# Patient Record
Sex: Male | Born: 1940 | Race: Black or African American | Hispanic: No | Smoking: Former smoker
Health system: Southern US, Community
[De-identification: ages and names within clinical notes are randomized; demographics above are authoritative.]

## PROBLEM LIST (undated history)

## (undated) DIAGNOSIS — I10 Essential (primary) hypertension: Secondary | ICD-10-CM

## (undated) DIAGNOSIS — E785 Hyperlipidemia, unspecified: Secondary | ICD-10-CM

## (undated) HISTORY — PX: APPENDECTOMY: SHX54

## (undated) HISTORY — DX: Hyperlipidemia, unspecified: E78.5

## (undated) HISTORY — DX: Essential (primary) hypertension: I10

---

## 2007-12-28 ENCOUNTER — Emergency Department: Payer: Self-pay | Admitting: Unknown Physician Specialty

## 2008-09-30 ENCOUNTER — Encounter: Payer: Self-pay | Admitting: Unknown Physician Specialty

## 2008-10-27 ENCOUNTER — Encounter: Payer: Self-pay | Admitting: Unknown Physician Specialty

## 2008-11-27 ENCOUNTER — Encounter: Payer: Self-pay | Admitting: Unknown Physician Specialty

## 2010-05-30 ENCOUNTER — Emergency Department: Payer: Self-pay | Admitting: Emergency Medicine

## 2010-08-06 ENCOUNTER — Ambulatory Visit: Payer: Self-pay | Admitting: Urology

## 2012-02-27 ENCOUNTER — Ambulatory Visit: Payer: Self-pay | Admitting: Gastroenterology

## 2012-02-27 LAB — COMPREHENSIVE METABOLIC PANEL
Albumin: 4.2 g/dL (ref 3.4–5.0)
Bilirubin,Total: 0.9 mg/dL (ref 0.2–1.0)
Chloride: 105 mmol/L (ref 98–107)
Co2: 28 mmol/L (ref 21–32)
Creatinine: 1.25 mg/dL (ref 0.60–1.30)
EGFR (African American): >60
Osmolality: 284 (ref 275–301)
Potassium: 3.4 mmol/L — ABNORMAL LOW (ref 3.5–5.1)
SGOT(AST): 41 U/L — ABNORMAL HIGH (ref 15–37)
Sodium: 140 mmol/L (ref 136–145)
Total Protein: 8.2 g/dL (ref 6.4–8.2)

## 2012-02-27 LAB — CBC
HGB: 15.1 g/dL (ref 13.0–18.0)
MCHC: 33.1 g/dL (ref 32.0–36.0)
Platelet: 219 10*3/uL (ref 150–440)
RBC: 5.27 10*6/uL (ref 4.40–5.90)
RDW: 14.1 % (ref 11.5–14.5)

## 2012-02-27 LAB — TROPONIN I: Troponin-I: <0.02 ng/mL

## 2012-02-27 LAB — LIPASE, BLOOD: Lipase: 132 U/L (ref 73–393)

## 2012-08-25 ENCOUNTER — Emergency Department: Payer: Self-pay | Admitting: Emergency Medicine

## 2012-08-25 LAB — BASIC METABOLIC PANEL
Anion Gap: 9 (ref 7–16)
Calcium, Total: 9.1 mg/dL (ref 8.5–10.1)
Co2: 25 mmol/L (ref 21–32)
EGFR (African American): >60
EGFR (Non-African Amer.): 57 — ABNORMAL LOW
Sodium: 139 mmol/L (ref 136–145)

## 2012-08-25 LAB — URINALYSIS, COMPLETE
Nitrite: NEGATIVE
Ph: 6 (ref 4.5–8.0)
Protein: 100
Squamous Epithelial: NONE SEEN

## 2012-08-25 LAB — CBC
HCT: 41.7 % (ref 40.0–52.0)
HGB: 13.8 g/dL (ref 13.0–18.0)
MCH: 28.2 pg (ref 26.0–34.0)
MCV: 85 fL (ref 80–100)
RBC: 4.9 10*6/uL (ref 4.40–5.90)

## 2012-08-25 LAB — TROPONIN I: Troponin-I: <0.02 ng/mL

## 2012-08-26 ENCOUNTER — Inpatient Hospital Stay: Payer: Self-pay | Admitting: Internal Medicine

## 2012-08-26 LAB — BASIC METABOLIC PANEL
Anion Gap: 10 (ref 7–16)
BUN: 28 mg/dL — ABNORMAL HIGH (ref 7–18)
Calcium, Total: 9.1 mg/dL (ref 8.5–10.1)
Chloride: 106 mmol/L (ref 98–107)
Co2: 24 mmol/L (ref 21–32)
EGFR (Non-African Amer.): 59 — ABNORMAL LOW
Osmolality: 287 (ref 275–301)

## 2012-08-26 LAB — URINALYSIS, COMPLETE
Glucose,UR: NEGATIVE mg/dL (ref 0–75)
Ketone: NEGATIVE
Leukocyte Esterase: NEGATIVE
Nitrite: NEGATIVE
Protein: 30
RBC,UR: 4028 /HPF (ref 0–5)
Specific Gravity: 1.005 (ref 1.003–1.030)

## 2012-08-26 LAB — CBC
HGB: 12.5 g/dL — ABNORMAL LOW (ref 13.0–18.0)
Platelet: 252 10*3/uL (ref 150–440)
RDW: 13.9 % (ref 11.5–14.5)
WBC: 8 10*3/uL (ref 3.8–10.6)

## 2012-08-26 LAB — PROTIME-INR: Prothrombin Time: 13.2 secs (ref 11.5–14.7)

## 2012-08-27 LAB — URINE CULTURE

## 2014-01-27 LAB — COMPREHENSIVE METABOLIC PANEL
ALK PHOS: 98 U/L
Albumin: 3.9 g/dL (ref 3.4–5.0)
Anion Gap: 9 (ref 7–16)
BILIRUBIN TOTAL: 0.9 mg/dL (ref 0.2–1.0)
BUN: 20 mg/dL — ABNORMAL HIGH (ref 7–18)
CALCIUM: 9.3 mg/dL (ref 8.5–10.1)
CHLORIDE: 103 mmol/L (ref 98–107)
Co2: 28 mmol/L (ref 21–32)
Creatinine: 1.09 mg/dL (ref 0.60–1.30)
EGFR (African American): >60
EGFR (Non-African Amer.): >60
GLUCOSE: 90 mg/dL (ref 65–99)
OSMOLALITY: 282 (ref 275–301)
Potassium: 3.3 mmol/L — ABNORMAL LOW (ref 3.5–5.1)
SGOT(AST): 33 U/L (ref 15–37)
SGPT (ALT): 38 U/L (ref 12–78)
SODIUM: 140 mmol/L (ref 136–145)
TOTAL PROTEIN: 7.7 g/dL (ref 6.4–8.2)

## 2014-01-27 LAB — CBC
HCT: 41.9 % (ref 40.0–52.0)
HGB: 14.1 g/dL (ref 13.0–18.0)
MCH: 28.6 pg (ref 26.0–34.0)
MCHC: 33.6 g/dL (ref 32.0–36.0)
MCV: 85 fL (ref 80–100)
Platelet: 174 10*3/uL (ref 150–440)
RBC: 4.93 10*6/uL (ref 4.40–5.90)
RDW: 14 % (ref 11.5–14.5)
WBC: 6.9 10*3/uL (ref 3.8–10.6)

## 2014-01-28 ENCOUNTER — Inpatient Hospital Stay: Payer: Self-pay | Admitting: Specialist

## 2014-01-28 LAB — URINALYSIS, COMPLETE
Bilirubin,UR: NEGATIVE
GLUCOSE, UR: NEGATIVE mg/dL (ref 0–75)
NITRITE: POSITIVE
PH: 5 (ref 4.5–8.0)
Protein: 30
RBC,UR: 763 /HPF (ref 0–5)
Specific Gravity: 1.02 (ref 1.003–1.030)
Squamous Epithelial: <1

## 2014-01-28 LAB — PROTIME-INR
INR: 0.9
Prothrombin Time: 12.3 secs (ref 11.5–14.7)

## 2014-01-28 LAB — LIPASE, BLOOD: Lipase: 178 U/L (ref 73–393)

## 2014-01-29 LAB — CBC WITH DIFFERENTIAL/PLATELET
BASOS ABS: 0 10*3/uL (ref 0.0–0.1)
BASOS PCT: 0.2 %
Eosinophil #: 0.2 10*3/uL (ref 0.0–0.7)
Eosinophil %: 1.4 %
HCT: 36 % — ABNORMAL LOW (ref 40.0–52.0)
HGB: 11.6 g/dL — ABNORMAL LOW (ref 13.0–18.0)
LYMPHS ABS: 0.8 10*3/uL — AB (ref 1.0–3.6)
LYMPHS PCT: 6.5 %
MCH: 27.8 pg (ref 26.0–34.0)
MCHC: 32.1 g/dL (ref 32.0–36.0)
MCV: 87 fL (ref 80–100)
MONO ABS: 0.4 x10 3/mm (ref 0.2–1.0)
Monocyte %: 3.6 %
NEUTROS PCT: 88.3 %
Neutrophil #: 10.9 10*3/uL — ABNORMAL HIGH (ref 1.4–6.5)
PLATELETS: 136 10*3/uL — AB (ref 150–440)
RBC: 4.15 10*6/uL — ABNORMAL LOW (ref 4.40–5.90)
RDW: 14.3 % (ref 11.5–14.5)
WBC: 12.3 10*3/uL — ABNORMAL HIGH (ref 3.8–10.6)

## 2014-01-29 LAB — MAGNESIUM: MAGNESIUM: 1.6 mg/dL — AB

## 2014-01-29 LAB — BASIC METABOLIC PANEL
ANION GAP: 6 — AB (ref 7–16)
BUN: 20 mg/dL — AB (ref 7–18)
CALCIUM: 8 mg/dL — AB (ref 8.5–10.1)
CO2: 25 mmol/L (ref 21–32)
CREATININE: 1.21 mg/dL (ref 0.60–1.30)
Chloride: 110 mmol/L — ABNORMAL HIGH (ref 98–107)
EGFR (African American): >60
EGFR (Non-African Amer.): 59 — ABNORMAL LOW
Glucose: 84 mg/dL (ref 65–99)
OSMOLALITY: 283 (ref 275–301)
POTASSIUM: 3.7 mmol/L (ref 3.5–5.1)
Sodium: 141 mmol/L (ref 136–145)

## 2014-01-29 LAB — PSA: PSA: 3.1 ng/mL (ref 0.0–4.0)

## 2014-01-30 LAB — URINE CULTURE

## 2014-01-30 LAB — CULTURE, BLOOD (SINGLE)

## 2014-02-03 LAB — CULTURE, BLOOD (SINGLE)

## 2014-02-05 ENCOUNTER — Ambulatory Visit: Payer: Self-pay | Admitting: Ophthalmology

## 2014-02-19 DIAGNOSIS — N139 Obstructive and reflux uropathy, unspecified: Secondary | ICD-10-CM | POA: Insufficient documentation

## 2014-02-19 DIAGNOSIS — C61 Malignant neoplasm of prostate: Secondary | ICD-10-CM | POA: Insufficient documentation

## 2014-02-19 DIAGNOSIS — R310 Gross hematuria: Secondary | ICD-10-CM | POA: Insufficient documentation

## 2014-02-19 DIAGNOSIS — N390 Urinary tract infection, site not specified: Secondary | ICD-10-CM | POA: Insufficient documentation

## 2014-03-25 ENCOUNTER — Ambulatory Visit: Payer: Self-pay | Admitting: Ophthalmology

## 2014-05-28 DIAGNOSIS — I10 Essential (primary) hypertension: Secondary | ICD-10-CM | POA: Insufficient documentation

## 2014-05-28 DIAGNOSIS — N400 Enlarged prostate without lower urinary tract symptoms: Secondary | ICD-10-CM | POA: Insufficient documentation

## 2014-07-01 DIAGNOSIS — E010 Iodine-deficiency related diffuse (endemic) goiter: Secondary | ICD-10-CM | POA: Insufficient documentation

## 2014-08-17 ENCOUNTER — Emergency Department: Payer: Self-pay | Admitting: Student

## 2014-12-16 NOTE — Consult Note (Signed)
Patient seen, chart review, films reviewed, note dictated. Gross hematuria, clot retention, prostate cancer. The CT scan demonstrates a stable mass affect on the base of the bladder.  This may be prostate.  There is a reported history of prostate cancer.  [name] has had no radiation therapy.  [name] was apparently placed on "pills" for treatment.  I assume this means finasteride were similar medication.  [name] had a recent followup with Dr. Ernst Spell.  [name] was scheduled for a CT scan for further evaluation.  [name] presented to the emergency room with gross hematuria.  No urine culture was obtained.  Bacteria and white blood cells were present with blood.  This may represent a urinary tract infection as the source of bleeding.  [name] states [name] was voiding well up until the time the hematuria started.  [name] was having no pain or discomfort.  [name] felt [name] was emptying to adequate completion.  It is unclear if [name] has had a recent cystoscopy.  I was informed that [name] had in a Foley catheter with no bladder irrigation.  [name] however has been on bladder irrigation since admission.  His urine is clear on slow CBI.  There has been no significant bleeding since admission as best as I can determine.  [name] had been taking Aleve twice daily for arthritis.  It is recommended that [name] discontinue this for now.  Utilize Tylenol only.  An order has been entered for catheter removal and discontinuation of CBI.  It is also unclear why [name] has been kept n.p.o.  A diet order has also been entered.  If [name] voids adequately with a reasonable postvoid residual, it is okay to proceed with discharge.  [name] will need a followup in the next week or so with Dr. Ernst Spell for further evaluation with cystoscopy.  [name] will need to complete a course of antibiotic therapy.  There is no indication for emergent urological evaluation or intervention at this time.  A family member present was requesting cystoscopy today with Dr. Ernst Spell.  This is likely not a possibility.  There is no  current indication for cystoscopy in the operating room setting.  Outpatient evaluation is all that is recommended.  If there are any further questions, please feel free to contact us.  If [name] voids with inadequate postvoid residual, the patient can be discharged.  Electronic Signatures: Murrell Redden (MD)  (Signed on 31-Dec-13 07:48)  Authored  Last Updated: 31-Dec-13 07:48 by Murrell Redden (MD)

## 2014-12-16 NOTE — H&P (Signed)
PATIENT NAMEBALTASAR, [name] MR#:  [id number] DATE OF BIRTH:  1941-05-04  DATE OF ADMISSION:  08/26/2012  PRIMARY CARE PHYSICIAN:  Domenick Gong, MD.  REFERRING PHYSICIAN:  Lenise Arena, MD.  CHIEF COMPLAINT: Gross hematuria and inability to urinate.   HISTORY OF PRESENT ILLNESS: The patient is a 74 year old African American male with history of systemic hypertension. He was doing well until Friday evening, that is 2 days ago, when he started to have gross hematuria. He appeared at the Emergency Department yesterday and he had some difficulty in urination at that time with urinary retention.  A Foley catheter was inserted and the patient was discharged on oral antibiotic using ciprofloxacin to have outpatient follow-up with a urologist. However, the patient appeared again at the Emergency Department today, his bladder was distended and the urinary catheter is blocked. His gross hematuria had persisted.  Other than that, the patient has no symptoms. Denies any fever. No chills. No abdominal pain other than the discomfort and distention feeling at the bladder area.    REVIEW OF SYSTEMS:  CONSTITUTIONAL: No fever. No chills. No fatigue.  EYES: No blurring of vision. No double vision.  ENT: No hearing impairment. No sore throat. No dysphagia.  CARDIOVASCULAR: No chest pain. No shortness of breath. No syncope.  RESPIRATORY: No shortness of breath. No cough. No hemoptysis. No chest pain.  GASTROINTESTINAL: No abdominal pain other than the suprapubic discomfort. No vomiting. No diarrhea.  GENITOURINARY: He has gross hematuria.  Additionally, he has discomfort and pain at the suprapubic area and inability to urinate. No frequency of urination.  MUSCULOSKELETAL: No joint pain or swelling. No muscular pain or swelling.  INTEGUMENTARY: No skin rash. No ulcers.  NEUROLOGY: No focal weakness. No seizure activity. No headache.  PSYCHIATRY: No anxiety. No depression.  ENDOCRINE: No polyuria or  polydipsia. No heat or cold intolerance.  HEMATOLOGY: No easy bruisability. No lymph node enlargement.   PAST MEDICAL HISTORY:  Systemic hypertension. In July of this year he had dysphagia secondary to food lodged in the lower end of the esophagus. He underwent upper endoscopy which showed esophagitis and the impacted food was removed. There was also gastric ulcer and duodenitis.   PAST SURGICAL HISTORY: Appendectomy.   SOCIAL HABITS:  Ex chronic smoker. He quit in 1992.  Prior to that he smoked 1 pack a day since age of 37. He drinks alcohol on weekends, primarily little beer and some rum.  FAMILY HISTORY: He has no information about his father, how he died. His mother died after having a heart attack at the age of 46.   SOCIAL HISTORY: He is a retired Librarian, academic at [employer]. He lives at home alone.   ADMISSION MEDICATIONS:  Verapamil SR 120 mg twice a day and hydrochlorothiazide with lisinopril 20/12.5 mg once a day.  Yesterday he was started on ciprofloxacin 500 mg twice a day. He also takes medication for claustrophobia.   ALLERGIES: No known drug allergies.   PHYSICAL EXAMINATION: VITAL SIGNS: Blood pressure 144/73, respiratory rate 18, pulse 86, temperature 97.8, pulse oximetry 96%.  GENERAL APPEARANCE: Elderly male lying in bed in no acute distress. He felt much relief after irrigation of his catheter.  HEAD: No pallor. No icterus. No cyanosis.  ENT: Ear examination revealed normal hearing. No discharge. No lesions. Nasal mucosa was normal and showed no lesions, no discharge. No ulcers. Oropharyngeal area was normal without any ulcers, no oral thrush. He is edentulous and wearing dentures.  EYES: Normal eyelids  and conjunctiva. Pupils both of them were constricted. I could not see reactivity to light.  NECK: Supple. Trachea at midline. No thyromegaly. No cervical lymphadenopathy. No masses.  HEART: Normal S1, S2. No S3, S4. No murmur. No gallop. No carotid bruits.  LUNGS: Normal  breathing pattern without use of accessory muscles. No rales. No wheezing.  ABDOMEN: Soft without tenderness. No hepatosplenomegaly. No masses. No hernias.  SKIN: No ulcers. No subcutaneous nodules.  MUSCULOSKELETAL: No joint swelling. No clubbing.   NEUROLOGIC: Cranial nerves II through XII are intact. No focal motor deficit.  UROLOGIC:  The patient has a Foley catheter inserted and it is draining gross blood with some clots.  PSYCHIATRIC: The patient is alert and oriented x 3. Mood and affect were normal.   LABORATORY FINDINGS:  Serum glucose 133, BUN 28, creatinine 1.2, sodium 140, potassium 3.9.  His troponin less than 0.02. CBC showed white count of 8000, hemoglobin 12.5. His hemoglobin yesterday was 13.8, hematocrit 38, yesterday was 41. Platelet count 252. Urinalysis done yesterday showed red-colored urine, 100 of protein, +3 blood, 13,209 red blood cells, 1600 white blood cells, +3 bacteria.   ASSESSMENT: 1.  Gross hematuria.  2.  Urinary retention likely secondary to the blood clots; however, intravesical lesions or  TMRs versus stones are other possibilities.  3.  Mild anemia secondary to gross hematuria.  4.  Systemic hypertension.   PLAN: We will admit the patient to the medical floor and consultation with urology. I will keep the Foley catheter for the time being. The catheter was irrigated with 30 mL of normal saline, following which patency of the catheter was restored. The patient had relief after that.   Blood clots were observed passing through with the urinary catheter. I will send urine for culture and I will continue ciprofloxacin that was started yesterday. I will continue his home medications. The patient evidently is going to need cystoscopy. Regarding his anemia, his hemoglobin was normal yesterday at 13.8, now dropped to 12.5. I will repeat the hemoglobin in the morning. For deep vein thrombosis prophylaxis I will place TED stockings since anticoagulation is contraindicated  with his gross hematuria. The patient indicates that he has a LIVING WILL and he had appointed his daughter to have the power of attorney.   CODE STATUS:  He is full code.   Time Spent in evaluating this patient: More than 50 minutes.    ____________________________ Clovis Pu. Lenore Manner, MD amd:ct D: 08/26/2012 23:42:55 ET T: 08/27/2012 13:22:03 ET JOB#: [id number]  cc: Clovis Pu. Lenore Manner, MD, <Dictator> Ellin Saba MD ELECTRONICALLY SIGNED 08/28/2012 6:32

## 2014-12-19 NOTE — Discharge Summary (Signed)
PATIENT NAMEMATAEO, [name] MR#:  [id number] DATE OF BIRTH:  01/19/1941  DATE OF ADMISSION:  08/26/2012 DATE OF DISCHARGE:  08/28/2012  DISCHARGE DIAGNOSES: 1. Hematuria.  2. Urinary tract infection.  3. Prostate cancer.  4. Hypertension.   HISTORY OF PRESENT ILLNESS: The patient is a 74 year old African American male with history of systemic hypertension. He was doing well until Friday evening, that was 2 days ago, presentation to the Emergency Room when he started having gross hematuria. He presented to the Emergency Room the next day and had some difficulty in the urination at that time with urinary retention. A Foley catheter was inserted, and he was discharged home on oral antibiotics, using ciprofloxacin, to have outpatient followup with a urologist.  He appeared in the Emergency Room the next day, again, because his bladder was distended and the urinary catheter was blocked. His gross hematuria has persisted. Other than that, there has been no symptoms. He denied any fever or chills, abdominal pain or discomfort.   HOSPITAL COURSE: Urology physicians were consulted for further management, and in the ER the catheter was irrigated with 30 mL of  normal saline; and following that, the patency of the catheter was restored and he had relief with bladder discomfort after that.  Blood clots were observed passing through the urinary catheter. Urine was sent for culture, and he was continued on ciprofloxacin that was started the day before. His hemoglobin on the previous day when he came to the Emergency Room was 13.8, and it dropped to 12.5 the next day at presentation to the Emergency Room. He was monitored for that on the medical floor.  A urologist saw him in the ER and started him on continuous bladder irrigation. We followed his hemoglobin, which remained stable throughout the hospital course. Urologic consult with Dr. Jacqlyn Larsen the next day in the hospital was done. His urine drainage through the  irrigation catheter remained clear, and so Dr. Jacqlyn Larsen suggested that he has to follow with Dr. Ernst Spell, who was his primary urologist. Advised to have outpatient followup with Dr. Ernst Spell and advised to discharge the patient with antibiotics.   Other medical issues during this hospital stay:  1. Hypertension: Stable. Continue home medications. 2. Mild anemia secondary to gross hematuria: It remained stable. 3. Urinary retention: Improved after continuous bladder irrigation.  4. History of prostate cancer: He was advised to follow up with his primary urologist after discharge.   LABORATORY DATA: His hemoglobin remained up to 12.4 on discharge, no further drop. Urine culture suggestive of contamination.   CONDITION ON DISCHARGE: Stable.   CODE STATUS ON DISCHARGE: FULL CODE.   MEDICATIONS ADVISED ON DISCHARGE: Hydrochlorothiazide/lisinopril 1 tablet orally once a day, verapamil 180 mg 12-hour oral tablet once a day, acetaminophen/oxycodone 325/5 mg oral tablet every 4 to 6 hours as needed for pain, and ciprofloxacin 500 mg oral tablet every 12 hours for 7 days.   HOME HEALTH: No.   HOME OXYGEN: No.   DIET: Low sodium and consistency regular.   ACTIVITY LIMITATIONS: As tolerated.   FOLLOWUP: Follow up with primary care physician and with Dr. Madelin Headings, the urologist, after 1 to 2 weeks.   TIME SPENT ON DISCHARGE: 45 minutes.   ____________________________ Ceasar Lund Anselm Jungling, MD vgv:cb D: 09/03/2012 08:42:47 ET T: 09/03/2012 09:50:00 ET JOB#: [id number]  cc: Ceasar Lund. Anselm Jungling, MD, <Dictator> Fonnie Jarvis. Ilene Qua, MD Vaughan Basta MD ELECTRONICALLY SIGNED 10/09/2012 15:08

## 2014-12-20 NOTE — Consult Note (Signed)
Patient name: [name]  Date of birth: 1941/05/01  Date of consult: January 28, 2014  Reason for consult: Hematuria, sepsis, history of prostate cancer  History:      Mr. Harpole is a 74 year old after American gentleman who presented to the emergency room with hematuria and back pain.  On presentation, he was noted to have a temperature of 103.  A CT scan was obtained demonstrating no evidence of hydronephrosis, renal mass, or obstruction.  He was noted to have significant prostatic enlargement with intravesical protrusion of the prostate.  He also has a history of diverticulosis.  The CT scan did confirm diverticulosis without evidence of diverticulitis.  He was admitted due to sepsis related to an apparent urinary tract infection.  A Foley catheter has been placed although the stated that there would be an attempt at not placing a Foley catheter.  There was no documentation of any urine volume drained from the catheter placement.  200 mL is the first documented urine in the chart.  This is not consistent with urinary retention.  He has had episodes of urinary retention in the past.  He has been under the care of Dr. Ernst Spell for a number of years.  He was diagnosed with Gleason's 6 (3+3) adenocarcinoma the prostate with a single focus at the left lateral base of less than 5%.  He was found to have an iliac abnormality.  He underwent evaluation at [facility].  He underwent subsequent biopsy of the iliac bone lesion.  This returned benign.  It was recommended based on the low volume disease that observation only be undertaken.  He has been utilizing Flomax.  This is not listed on today's evaluation.  It does not appear that he has ever taking finasteride.  He states he had a PSA recently with Dr. Ilene Qua.  We will obtain a copy of this for review.  His initial PSA at the time of biopsy was around 28.  It was felt that his large prostate volume was the main factor with PSA elevation and not the malignancy.  There  is minimal bleeding on current evaluation.  The bleeding is likely related to the large prostate and infection.  The infection is likely also related to the large prostate and bladder emptying.  He will need eventual evaluation from the hematuria standpoint.  He denies any other significant prior urological history.  Past medical history: Hypertension, prostate cancer, peptic ulcer disease, dysphagia, bladder outlet obstruction  Past surgical history: Appendectomy, hand surgery, iliac biopsy, prostate biopsy  Social history: There is a past history of tobacco use.  He quit in 1992.  He also has a history of alcohol use.  He denies any drug use.  Family history: Coronary artery disease  Medications on admission: Verapamil SR 120 mg twice daily, hydrochlorothiazide/lisinopril 20/12.5 twice daily, tamsulosin 0.4 mg daily  Allergies: No known drug allergies  Physical examination:      Temp: 98.7 heart rate: 81  respiratory rate: 18  blood pressure: 116/76 Gen.: Awake, alert, oriented 4 HEENT: Within normal limits Chest: Clear to auscultation bilaterally.  No abnormal breath sounds Cardiovascular: Regular rate and rhythm Abdomen: Soft, nontender, nondistended, no palpable masses, no appreciable CVA tenderness GU: Foley catheter in place draining minimally blood-tinged urine, phallus within normal limits Extremities: Free range of motion 4 Neuro: Motor and sensory grossly intact  Assessment: Low-grade/low risk prostate cancer, hematuria, bladder outlet obstruction  Recommendation: The bleeding is likely secondary to bladder infection from his  enlarged prostate.  The Flomax needs to be restarted.  Addition of finasteride would likely be beneficial both from the prostate cancer and the prosthetic hypertrophy standpoint.  Given the extent of intravesical tissue, he may need an eventual TURP.  He will need a follow-up in the next few weeks as an outpatient for cystoscopy for further evaluation of  his bladder for other sources of the bleeding.  There is no indication for any intervention at present.  I will continue to follow with you.  It is okay to remove his Foley catheter at any time.  If there are any further questions, please feel free to contact us.  Electronic Signatures: Murrell Redden (MD)  (Signed on 02-Jun-15 16:55)  Authored  Last Updated: 02-Jun-15 16:55 by Murrell Redden (MD)

## 2014-12-20 NOTE — Discharge Summary (Signed)
PATIENT NAME:  [name], [name] MR#:  [id number] DATE OF BIRTH:  November 21, 1940  DATE OF ADMISSION:  01/28/2014 DATE OF DISCHARGE:  01/30/2014  For a detailed note, please look at the history and physical done on admission by Dr. Laurin Coder.   DIAGNOSES AT DISCHARGE: As follows: 1.  Sepsis secondary to urinary tract infection.  2.  Urinary tract infection secondary to Escherichia coli.  3.  Benign prostatic hypertrophy.  4.  Hypertension.  5.  Hypokalemia.   DIET: The patient is being discharged on a low-sodium diet.   ACTIVITY: As tolerated.  FOLLOWUP:  Dr. Domenick Gong in the next 1 to 2 weeks. Also follow up with Dr. Edrick Oh from urology in the next 2 weeks.  DISCHARGE MEDICATIONS: Hydrochlorothiazide/lisinopril 25/21 tab daily, verapamil 180 mg b.i.d., Flomax 0.4 mg daily and Ceftin 500 mg b.i.d. x 12 days.   PERTINENT STUDIES DONE DURING THE HOSPITAL COURSE:  Are as follows: A CT scan of the abdomen and pelvis done with contrast on 06/02 showing no acute intra-abdominal findings, chronic massive enlargement of the prostate gland, extensive colonic diverticulosis. The patient's urine culture to be positive for Escherichia coli. Blood cultures, also to be positive for Escherichia coli.   HOSPITAL COURSE: This is a 74 year old male with medical problems as mentioned above, presented to the hospital due to fevers, rigors and abnormal urinalysis.   PROBLEMS: 1.  Sepsis. This was the likely diagnosis given the fact that the patient had abnormal urinalysis consistent with urinary tract infection with high fevers, tachycardia and positive blood cultures. The most likely source of the patient's sepsis was urinary tract infection. The patient was empirically started on IV ceftriaxone. The patient's urine and blood cultures grew out Escherichia coli which was sensitive to ceftriaxone. The patient has been afebrile now for the past 48 hours and hemodynamically stable. He clinically feels much better and  therefore he is being discharged on oral Ceftin for 12 days for a total course of 14-day therapy for his underlying sepsis due to urinary tract infection. 2.  Hematuria. This was likely secondary to the urinary tract infection and underlying benign prostatic hypertrophy. The patient was seen by Dr. Jacqlyn Larsen. He recommended getting an outpatient cystoscopy. He will be referred to him as an outpatient. The patient did have a Foley catheter placed. It was discontinued. He is currently urinating well. He has been discharged on Flomax and his hematuria has now resolved.  3.  Hypotension. This was likely secondary to the sepsis. It has since then improved and resolved with IV fluids.  4.  Hypertension. The patient's blood pressure medications were initially held in the hospital as he was septic and hypotensive, although he will resume his home medications as he is now hemodynamically stable.  5.  Hypokalemia. This has since then been replaced and resolved.  6.  Benign prostatic hypertrophy with bladder outlet obstruction. The patient is being discharged on Flomax. He will follow up with Dr. Jacqlyn Larsen as an outpatient. He currently has no evidence of urinary obstruction and is urinating well.  TIME SPENT: 40 minutes.  ____________________________ Belia Heman. Verdell Carmine, MD vjs:ce D: 01/30/2014 15:22:17 ET T: 01/30/2014 20:16:58 ET JOB#: [id number]  cc: Belia Heman. Verdell Carmine, MD, <Dictator> Fonnie Jarvis. Ilene Qua, MD Denice Bors Jacqlyn Larsen, MD Henreitta Leber MD ELECTRONICALLY SIGNED 02/01/2014 21:55

## 2014-12-20 NOTE — H&P (Signed)
PATIENT NAME:  [name], [name] MR#:  [id number] DATE OF BIRTH:  1941-03-07  DATE OF ADMISSION:  01/28/2014  REASON FOR ADMISSION: Severe sepsis.   REFERRING PHYSICIAN: Dr. Carrie Mew   PRIMARY CARE PHYSICIAN: Dr. Ilene Qua  CHIEF COMPLAINT: Hematuria.   HISTORY OF PRESENT ILLNESS: This is a very nice, 74 year old gentleman who has history of hypertension, who has been in his normal state of health. Overall, he is very active. Lives by himself, but this past evening, at around 5:00 p.m., he started having significant hematuria and pain at the level of his back. The patient had an evolution of that pain, getting worse, more hematuria occurred, for what he decided to come to the Emergency Department. Here in the Emergency Department, the patient had severe fever, up to 103, shaking rigors, back spasms and, again, a temperature of 103, for what he received antibiotics. CT scan was done. As far as his pain goes, it is located in the middle of the back, radiating to both costovertebral angles. The patient had no acute intra-abdominal findings on a CT scan but massive enlargement of prostate. There was diverticulosis without diverticulitis. Again, the pain intensity was 9 out of 10, and was relieved with pain medications here in the ER. He was worse with ambulation. The patient has not had any significant urinary retention. He states that in the past, he has seen a urologist at [facility], and he was told that he might have prostate cancer, but it was not worrisome at the moment. The patient is admitted for treatment and evaluation of this condition.   REVIEW OF SYSTEMS: A 12-system review of systems review of systems is done.  CONSTITUTIONAL: Positive fever and chills this evening but no prior. No significant weight loss or weight gain.  EYES: No blurry vision, double vision.  EARS, NOSE, THROAT: No difficulty swallowing at this moment, but the patient had, in the past, a history of dysphagia secondary to a big  piece of meat stuck in his esophagus, now resolved.  CARDIOVASCULAR: No chest pain, shortness of breath, syncope.  RESPIRATORY: No chronic obstructive pulmonary disease, although the patient was smoker. He quit several years ago. No cough. No wheezing. The patient has been sneezing. GASTROINTESTINAL: No abdominal pain, constipation or diarrhea.  GENITOURINARY: Positive for gross hematuria. Positive dysuria. Positive costovertebral angle tenderness.  MUSCULOSKELETAL: No significant joint pain or swelling.  SKIN: No rashes or petechiae.  NEUROLOGIC: No numbness, tingling, or previous CVAs.  PSYCHIATRIC: No anxiety, depression, insomnia.  ENDOCRINE: No polyuria, polydipsia, polyphagia, cold or heat intolerance.  HEMATOLOGIC AND LYMPHATIC: No anemia, easy bruising. Positive hematuria.   PAST MEDICAL HISTORY: 1.  Hypertension.  2.  Apparently, history of prostate cancer.  3.  History of peptic ulcer with duodenitis.  4.  History of dysphagia secondary to food lodged in the lower end of the esophagus, now  resolved.   PAST SURGICAL HISTORY: Appendectomy and hand surgery.   SOCIAL HISTORY: The patient used to smoke. He quit in 1992. He used to smoke somewhere around 1 pack a day since the age of 74. The patient used to drink significant amounts of alcohol on a regular basis, but he quit a while ago as well. He lives by himself.   FAMILY HISTORY: Father died at an early age. His mother died from a heart attack at the age of 52.   MEDICATIONS: Verapamil SR 120 mg twice daily, hydrochlorothiazide with lisinopril 20/12.5 mg twice daily.   PHYSICAL EXAMINATION: VITAL SIGNS: Blood  pressure 95/48, pulse is 105, respirations 16, temperature 98.1. Highest temperature 103. Oxygen saturation 92% on room air.  GENERAL: The patient is alert and oriented x3, in no acute distress at this moment. No respiratory distress. The patient is diaphoretic. His gown and shirt are profusely wet with sweat.  HEENT: His  pupils are equal and reactive. Extraocular movements are intact. Mucosa is moist. Anicteric sclerae. Pink conjunctivae. No oral lesions. No oropharyngeal exudates.  NECK: Supple. No JVD. No thyromegaly. No adenopathy. No carotid bruits.  CARDIAC: Tachycardic. No displacement of PMI.  ABDOMEN: Soft, nontender, nondistended. No hepatosplenomegaly. No masses. Bowel sounds are positive.  GENITAL: Negative for external lesions. There is a small blood clot hanging out of the urethra. No testicular tenderness. No suprapubic tenderness.  EXTREMITIES: No edema, cyanosis or clubbing. Vascular pulses +2. Capillary refill less than 3. NEUROLOGIC: Cranial nerves II through XII intact. Strength is 5/5 in all four extremities. No focal findings.  SKIN: No rashes or petechiae.  LYMPHATIC: Negative for lymphadenopathy in neck or supraclavicular areas.  MUSCULOSKELETAL: No joint effusions or swelling.   RESULTS: CT scan of the abdomen and pelvis: No acute intra-abdominal findings, chronic massive enlargement of the prostate, extensive colonic diverticulosis without diverticulitis. This is a CT with contrast, by the way. There is circumferential wall thickening of the bladder related to chronic outlet obstruction, presumably. Kidneys: No hydronephrosis or stone. There is a right middle lobe 7 mm subpleural pulmonary nodule. His hemoglobin is actually normal at 14.1. White count 6.9. LFTs are within normal limits. Potassium is 2.3, sodium 140, creatinine 1.09, with a slight elevation of BUN at 20, glucose 90. Urinalysis shows 763 red blood cells, 31 white blood cells, with positive nitrites, trace leukocyte esterase, positive bacteria. Lactic acid of 5.5.   ASSESSMENT AND PLAN: This is a very nice 74 year old gentleman with hypertension and was told at some point that he had prostate cancer.   1.  Severe sepsis. The patient had criteria of severe sepsis based on lactic acid of 5 and low blood pressures. The patient has  received 2 liters of fluid, and his blood pressure is slowly decreasing. At this moment, we are going to continue fluids at 100 mL/h, giving extra bolus if necessary. Blood cultures taken. Urine culture taken. The patient is on Rocephin.  2.  Hematuria. The patient has no signs of urinary retention at this moment. We are going to put a Foley catheter if the patient is having difficulty urinating or if the hematuria persists. Urology consultation. We are going to check a PSA. Continue to monitor. Transfuse if necessary. Check PT, INR, and reverse if necessary.  3.  Hypotension. Continue IV fluids boluses as needed. Monitor under telemetry. His blood pressure is low, but not to the point of shock. The patient can go to the floor unless something drastically changes.  4.  Hypertension. Holding blood pressure medications.  5.  Hypokalemia. Replace with IV fluids.  6.  Gastrointestinal prophylaxis with Protonix. Deep vein thrombosis prophylaxis with compression devices. Avoid heparin, aspirin or Lovenox due to active bleeding.   CODE STATUS: The patient is a full code.   TIME SPENT: I spent about 45 minutes with this patient today.    ____________________________ Liberty Sink, MD rsg:cg D: 01/28/2014 03:38:43 ET T: 01/28/2014 04:20:24 ET JOB#: [id number]  cc: Knoll[place] Sink, MD, <Dictator> Christen Wardrop America Brown MD ELECTRONICALLY SIGNED 02/01/2014 20:13

## 2014-12-21 NOTE — Consult Note (Signed)
PATIENT NAMEPLES, [name] MR#:  [id number] DATE OF BIRTH:  08-31-1940  DATE OF CONSULTATION:  02/27/2012  REFERRING PHYSICIAN:   CONSULTING PHYSICIAN:  Jill Side, MD  REASON FOR CONSULTATION: Acute dysphagia.   HISTORY OF PRESENT ILLNESS: This 74 year old male apparently was in his normal state of health until about 48 hours ago when he developed acute onset dysphagia while eating a piece of steak. He was unable to eat or drink anything after that. He did not seek medical attention until this morning when he went to Urgent Care and was sent to my office. The patient had a cup and some napkins with him as apparently he has been drooling constantly, unable to swallow solids or liquids. Denies any chest pain or shortness of breath. Denies any prior history of food bolus impaction. No fever, chills, etc.  PAST MEDICAL HISTORY: Quite unremarkable. He denies any history of coronary artery disease, diabetes, or stroke, et Ronney Asters.   ALLERGIES: None.   SOCIAL HISTORY: According to him he does not smoke or drink.   FAMILY HISTORY: Unremarkable.   REVIEW OF SYSTEMS: Negative except for what is mentioned in the History of Present Illness.   PHYSICAL EXAMINATION:  GENERAL: Well built male who does not appear to be in any acute distress.   VITAL SIGNS: His vitals were stable. He was afebrile.   LUNGS: Grossly clear to auscultation bilaterally with fair air entry and no added sounds.   CARDIOVASCULAR: Regular rate and rhythm. No gallops or murmur.   ABDOMEN: Examination is quite benign with soft abdomen, nontender, nondistended. No rebound or guarding.   EXTREMITIES: No edema.   NEUROLOGIC: Examination appears to be grossly unremarkable.   LABORATORY, DIAGNOSTIC, AND RADIOLOGICAL DATA: CBC is within normal limits. BUN is 25. The rest of the electrolytes are fine. Serum lipase is 132. Portable chest x-ray unremarkable.   ASSESSMENT AND PLAN:  Patient with acute dysphagia secondary to  food bolus impaction. He has tried carbonated beverages; glucagon was given in the ER without any relief. It was decided to proceed with an upper GI endoscopy. This was discussed with the patient and he agreed. An upper GI endoscopy was performed.  A large food bolus was seen impacted at the lower end of the esophagus. The food bolus was retrieved with the help of basket and forceps. EGD also showed distal esophagitis and a gastric ulcer as well as duodenitis.   RECOMMENDATIONS: Nexium 40 mg once a day. Follow up with me in 4 weeks for a repeat EGD in 6 to 8 weeks.    ____________________________ Jill Side, MD si:bjt D: 02/27/2012 19:09:13 ET T: 02/28/2012 10:15:59 ET JOB#: [id number]  cc: Jill Side, MD, <Dictator> Jill Side MD ELECTRONICALLY SIGNED 03/01/2012 11:04

## 2014-12-21 NOTE — Consult Note (Signed)
Brief Consult Note: Diagnosis: Esophageal foreign body and dysphagia.   Patient was seen by consultant.   Comments: Patient with acute dysphagia while eating steak 48 hours ago. Unable to eat or drink anything since then. Carbonated drinks and Glucagon did not help.  Recommendations: Urgent EGD with disimpaction. The procedure was discussed with the patient in detail. Will proceed.  Electronic Signatures: Jill Side (MD)  (Signed 01-Jul-13 17:49)  Authored: Brief Consult Note   Last Updated: 01-Jul-13 17:49 by Jill Side (MD)

## 2015-01-21 ENCOUNTER — Other Ambulatory Visit: Payer: Self-pay | Admitting: Family Medicine

## 2015-01-21 DIAGNOSIS — E049 Nontoxic goiter, unspecified: Secondary | ICD-10-CM

## 2015-01-21 DIAGNOSIS — IMO0001 Reserved for inherently not codable concepts without codable children: Secondary | ICD-10-CM

## 2015-02-04 ENCOUNTER — Ambulatory Visit
Admission: RE | Admit: 2015-02-04 | Discharge: 2015-02-04 | Disposition: A | Discharge status: Home / Self Care | Payer: PPO | Source: Ambulatory Visit | Attending: Family Medicine | Admitting: Family Medicine

## 2015-02-04 ENCOUNTER — Ambulatory Visit: Payer: Self-pay

## 2015-02-04 ENCOUNTER — Ambulatory Visit: Payer: PPO

## 2015-02-04 DIAGNOSIS — E049 Nontoxic goiter, unspecified: Secondary | ICD-10-CM | POA: Diagnosis present

## 2015-02-04 DIAGNOSIS — I714 Abdominal aortic aneurysm, without rupture: Secondary | ICD-10-CM | POA: Diagnosis not present

## 2015-02-04 DIAGNOSIS — F17210 Nicotine dependence, cigarettes, uncomplicated: Secondary | ICD-10-CM | POA: Insufficient documentation

## 2015-02-04 DIAGNOSIS — I723 Aneurysm of iliac artery: Secondary | ICD-10-CM | POA: Diagnosis not present

## 2015-02-04 DIAGNOSIS — Z8679 Personal history of other diseases of the circulatory system: Secondary | ICD-10-CM | POA: Diagnosis present

## 2015-02-04 DIAGNOSIS — Z09 Encounter for follow-up examination after completed treatment for conditions other than malignant neoplasm: Secondary | ICD-10-CM | POA: Diagnosis present

## 2015-02-04 DIAGNOSIS — IMO0001 Reserved for inherently not codable concepts without codable children: Secondary | ICD-10-CM

## 2015-04-12 IMAGING — CT CT ABD-PELV W/ CM
2 of 5 series · 16 of 46 positions shown, 18 images · IV contrast (isovue)
Comparison: 09/27/2011

CLINICAL DATA: Back pain and hematuria.  SIRS,

EXAM:
CT ABDOMEN AND PELVIS WITH CONTRAST
TECHNIQUE: Multidetector CT imaging of the abdomen and pelvis was performed
using the standard protocol following bolus administration of
intravenous contrast.
CONTRAST:  125 cc Isovue 370 intravenous

[Series 2: routine abd pel with · axial · 0.85mm/px · z∈[-1014,-584]mm · 13 of 98 slices shown, 15 images]
[im 6/98  soft-tissue]
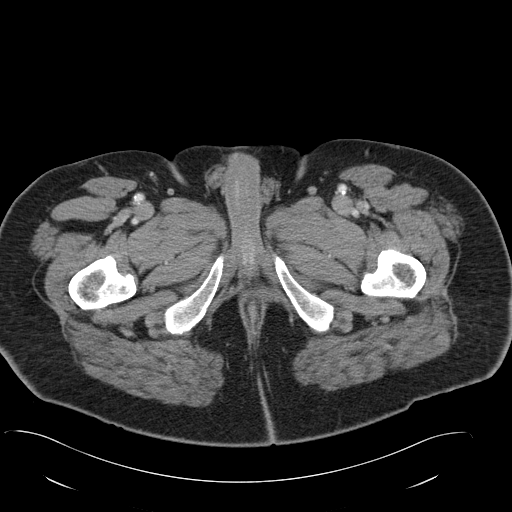
[im 6/98  bone]
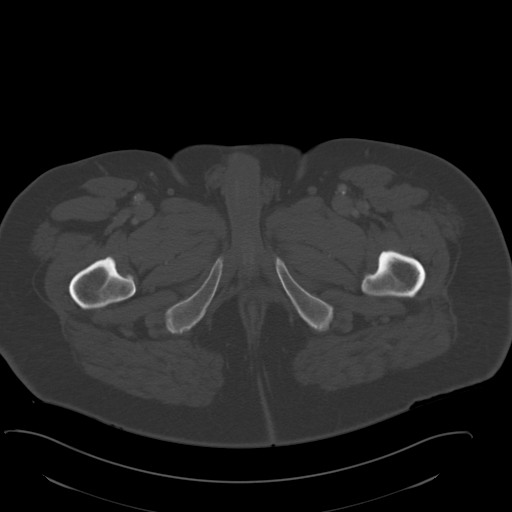
[im 16/98  soft-tissue]
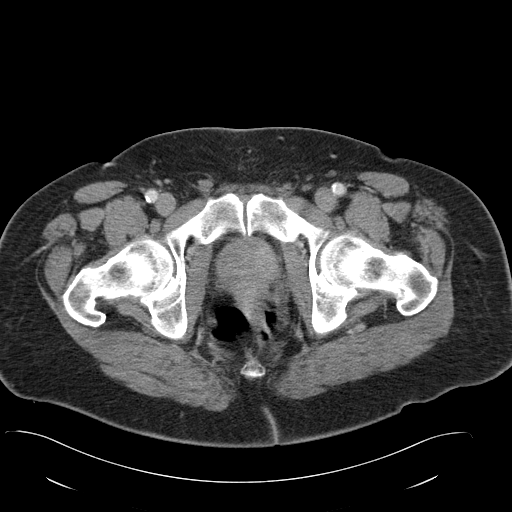
[im 21/98  soft-tissue]
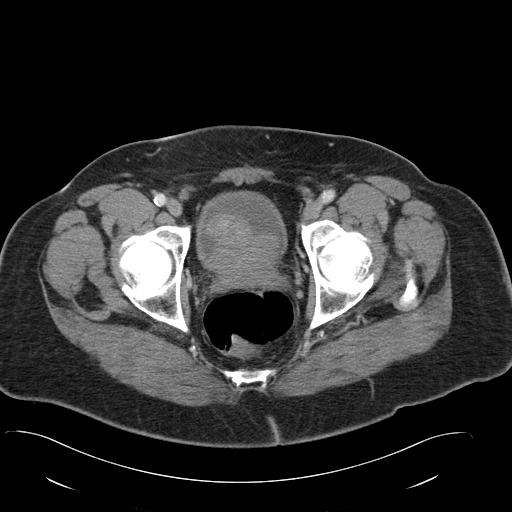
[im 26/98  soft-tissue]
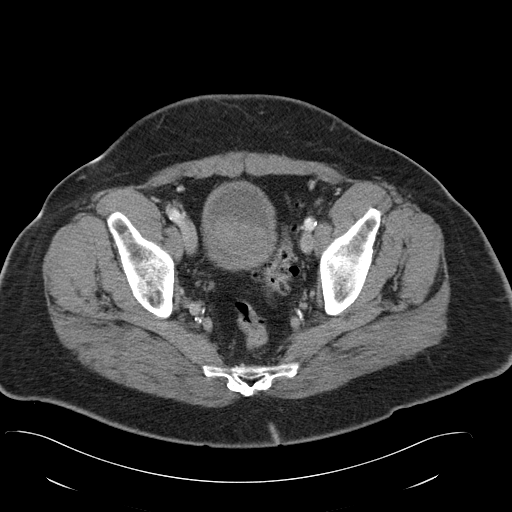
[im 36/98  soft-tissue]
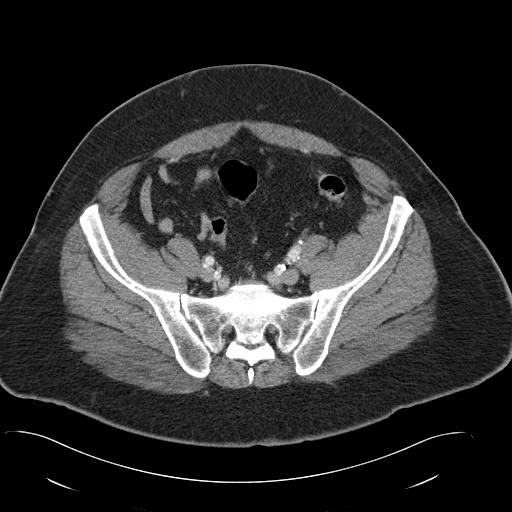
[im 41/98  soft-tissue]
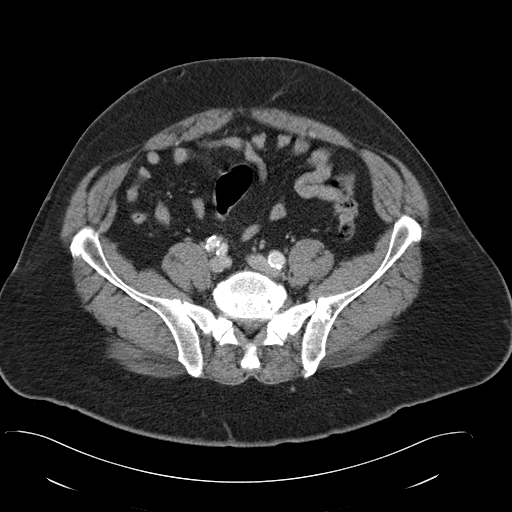
[im 52/98  soft-tissue]
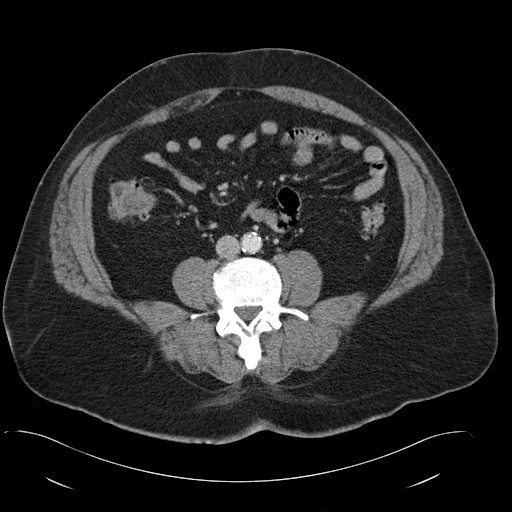
[im 57/98  soft-tissue]
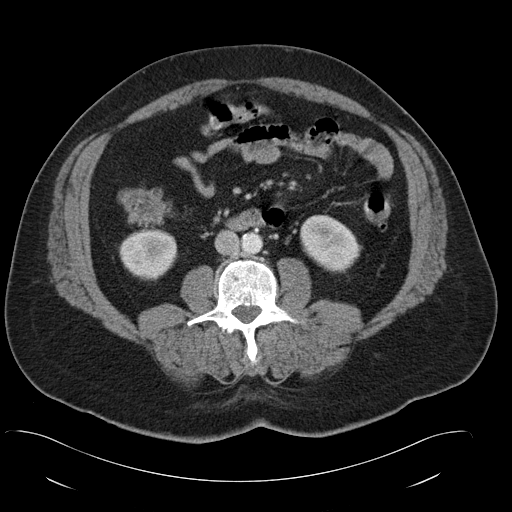
[im 62/98  soft-tissue]
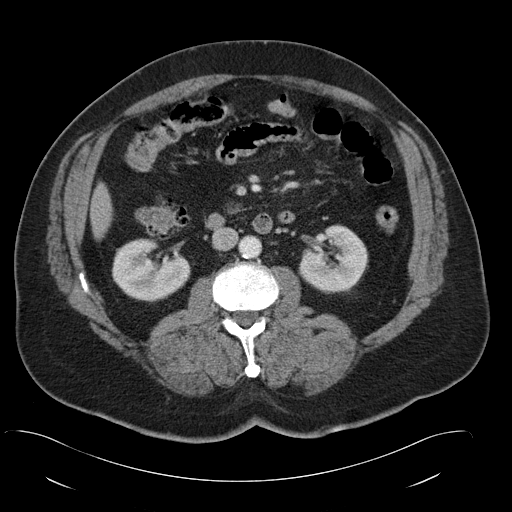
[im 62/98  bone]
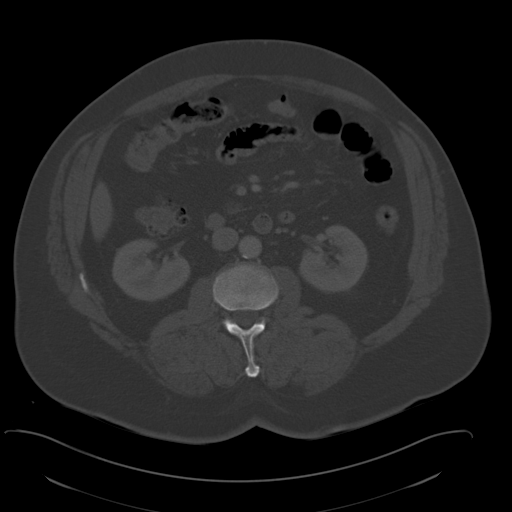
[im 72/98  soft-tissue]
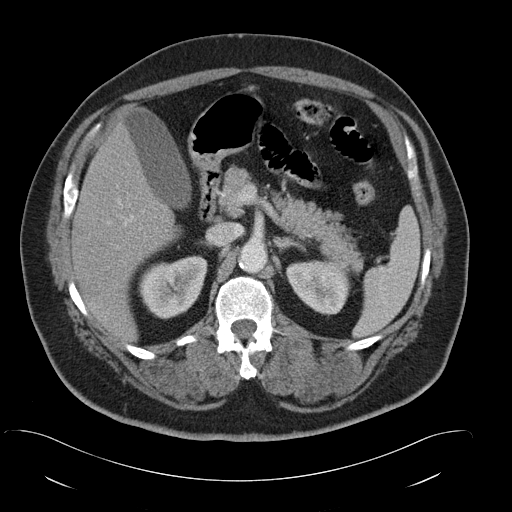
[im 77/98  soft-tissue]
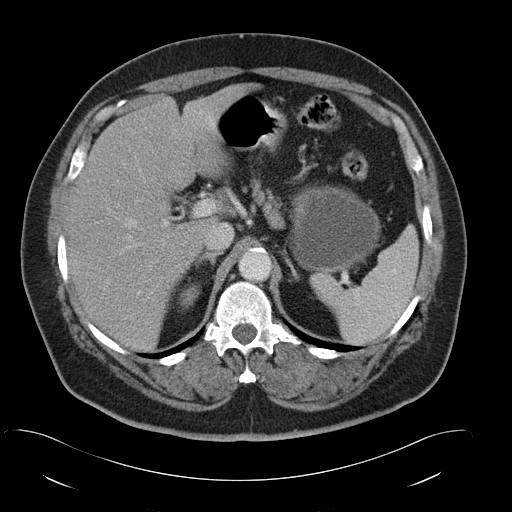
[im 82/98  soft-tissue]
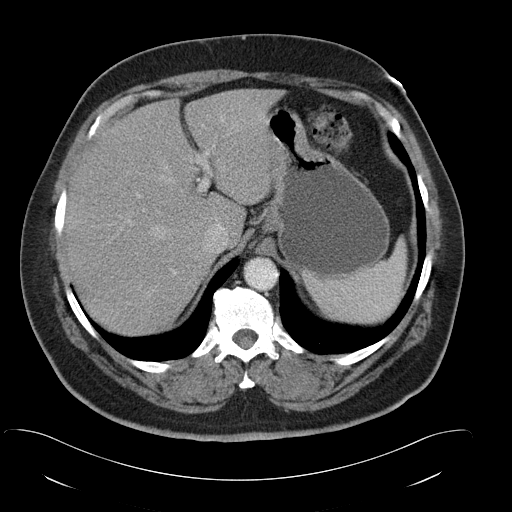
[im 92/98  soft-tissue]
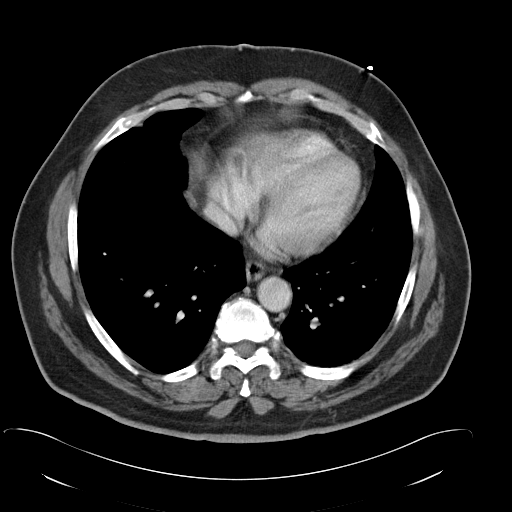

[Series 5: cor routine abd pel with · coronal · 0.83mm/px · 3 of 144 slices shown]
[im 48/144  soft-tissue]
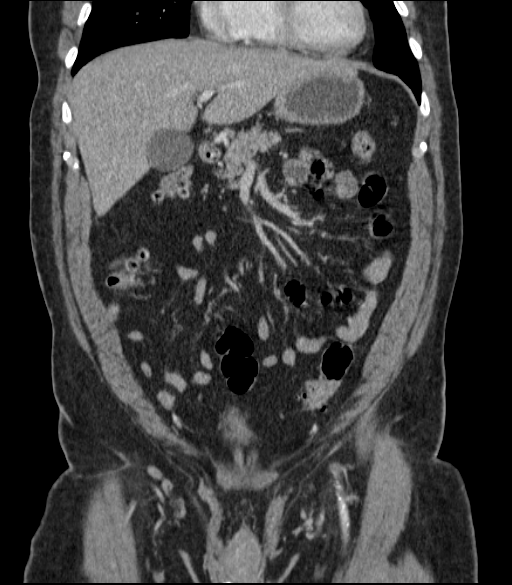
[im 64/144  soft-tissue]
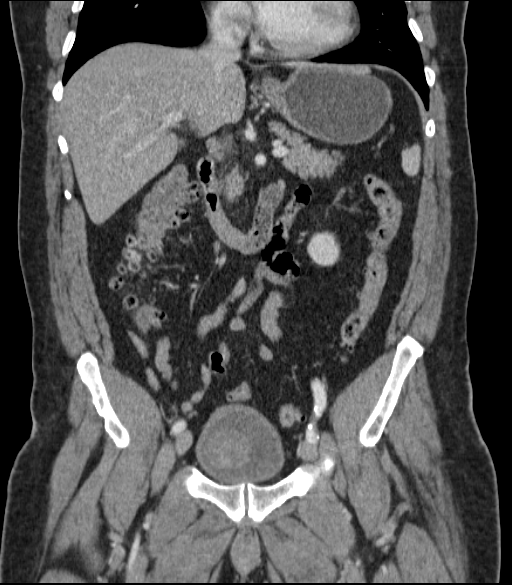
[im 80/144  soft-tissue]
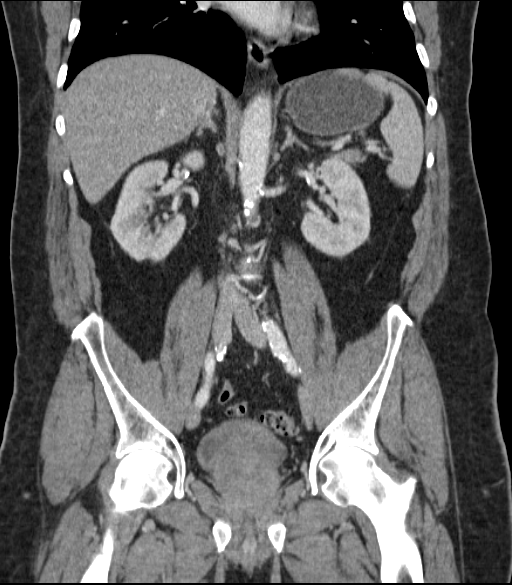

[16 of 46 positions shown; findings below may reference images not displayed]

FINDINGS: BODY WALL: Unremarkable.

LOWER CHEST: Stable subpleural pulmonary nodule in the right middle
lobe, 7 mm in maximal diameter.

ABDOMEN/PELVIS:

Liver: No focal abnormality.

Biliary: No evidence of biliary obstruction or stone.

Pancreas: Unremarkable.

Spleen: Unremarkable.

Adrenals: Unremarkable.

Kidneys and ureters: No hydronephrosis or stone. 17 mm interpolar
left renal cyst.

Bladder: Circumferential wall thickening related to chronic outlet
obstruction presumably.

Reproductive: Long-standing prostate enlargement with polypoid
central gland herniating into the bladder. Long-standing
calcification in the right scrotum, partially imaged.

Bowel: Extensive colonic diverticulosis. No pericecal inflammation.

Retroperitoneum: No mass or adenopathy.

Peritoneum: No free fluid or gas.

Vascular: Diffuse aortic and branch vessel atherosclerosis.

OSSEOUS: Long-standing (Since 3199 at least) sclerotic lesion in the
left ilium, neighboring the SI joint. There is lower lumbar facet
osteoarthritis with bony overgrowth and L4-5 anterolisthesis.
IMPRESSION: 1. No acute intra-abdominal findings.
2. Chronic, massive enlargement of the prostate gland.
3. Extensive colonic diverticulosis.

## 2015-04-12 IMAGING — CR DG CHEST 1V PORT
1 series · 1 of 1 positions shown · non-contrast
Comparison: 02/27/2012

CLINICAL DATA: Fever and tachycardia

EXAM:
PORTABLE CHEST - 1 VIEW

[ap]
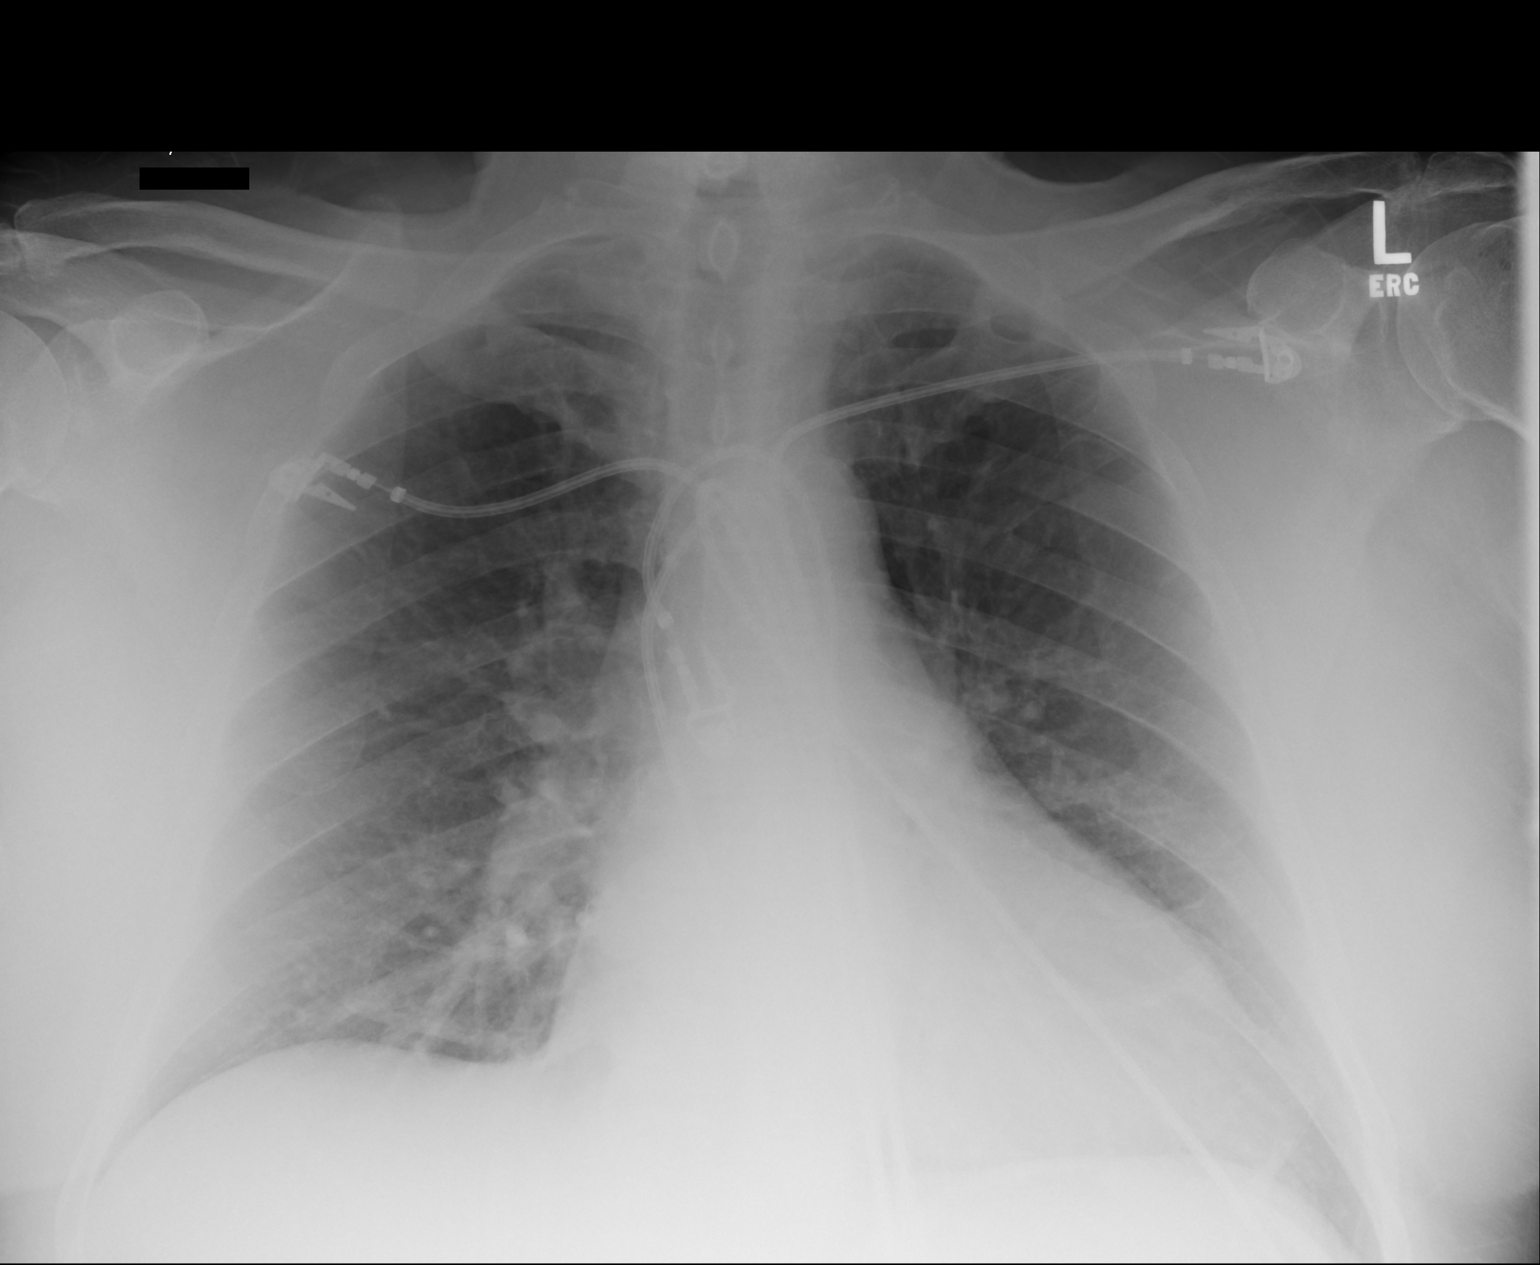

[1 of 1 positions shown; findings below may reference images not displayed]

FINDINGS: Chronic cardiomegaly. There is mild interstitial crowding at the
bases. No consolidation, edema, effusion, or pneumothorax.
IMPRESSION: No active disease.

## 2016-01-28 ENCOUNTER — Other Ambulatory Visit: Payer: Self-pay | Admitting: Family Medicine

## 2016-01-28 DIAGNOSIS — Z136 Encounter for screening for cardiovascular disorders: Secondary | ICD-10-CM

## 2016-02-03 ENCOUNTER — Ambulatory Visit: Admission: RE | Admit: 2016-02-03 | Payer: Medicare HMO | Source: Ambulatory Visit

## 2016-07-21 DIAGNOSIS — M2140 Flat foot [pes planus] (acquired), unspecified foot: Secondary | ICD-10-CM | POA: Insufficient documentation

## 2016-07-27 ENCOUNTER — Other Ambulatory Visit: Payer: Self-pay | Admitting: Family Medicine

## 2016-07-27 DIAGNOSIS — E049 Nontoxic goiter, unspecified: Secondary | ICD-10-CM

## 2016-08-05 ENCOUNTER — Other Ambulatory Visit: Payer: Self-pay | Admitting: Family Medicine

## 2016-08-05 DIAGNOSIS — I714 Abdominal aortic aneurysm, without rupture, unspecified: Secondary | ICD-10-CM

## 2016-08-08 ENCOUNTER — Ambulatory Visit
Admission: RE | Admit: 2016-08-08 | Discharge: 2016-08-08 | Disposition: A | Discharge status: Home / Self Care | Payer: Medicare HMO | Source: Ambulatory Visit | Attending: Family Medicine | Admitting: Family Medicine

## 2016-08-08 DIAGNOSIS — I714 Abdominal aortic aneurysm, without rupture, unspecified: Secondary | ICD-10-CM

## 2016-08-08 DIAGNOSIS — E049 Nontoxic goiter, unspecified: Secondary | ICD-10-CM | POA: Insufficient documentation

## 2016-08-18 ENCOUNTER — Encounter (INDEPENDENT_AMBULATORY_CARE_PROVIDER_SITE_OTHER): Payer: Self-pay | Admitting: Vascular Surgery

## 2016-08-18 ENCOUNTER — Ambulatory Visit (INDEPENDENT_AMBULATORY_CARE_PROVIDER_SITE_OTHER): Payer: Medicare HMO | Admitting: Vascular Surgery

## 2016-08-18 DIAGNOSIS — N401 Enlarged prostate with lower urinary tract symptoms: Secondary | ICD-10-CM | POA: Diagnosis not present

## 2016-08-18 DIAGNOSIS — R3911 Hesitancy of micturition: Secondary | ICD-10-CM | POA: Diagnosis not present

## 2016-08-18 DIAGNOSIS — N400 Enlarged prostate without lower urinary tract symptoms: Secondary | ICD-10-CM | POA: Insufficient documentation

## 2016-08-18 DIAGNOSIS — I10 Essential (primary) hypertension: Secondary | ICD-10-CM | POA: Diagnosis not present

## 2016-08-18 DIAGNOSIS — I714 Abdominal aortic aneurysm, without rupture, unspecified: Secondary | ICD-10-CM

## 2016-08-18 NOTE — Progress Notes (Signed)
MRN : [id number]  [name] is a 75 y.o. (August 15, 1941) male who presents with chief complaint of  Chief Complaint  Patient presents with  . New Evaluation    Abdominal aortic aneursym  .  History of Present Illness: The patient presents to the office for evaluation of an abdominal aortic aneurysm. The aneurysm was found incidentally by CT scan. Patient denies abdominal pain or unusual back pain, no other abdominal complaints.  No history of an acute onset of painful blue discoloration of the toes.     No family history of AAA.   Patient denies amaurosis fugax or TIA symptoms. There is no history of claudication or rest pain symptoms of the lower extremities.  The patient denies angina or shortness of breath.  CT scan shows an AAA that measures <4.00 cm  Current Meds  Medication Sig  . amLODipine (NORVASC) 2.5 MG tablet Take by mouth.  Marland Kitchen aspirin EC 81 MG tablet Take by mouth.  . citalopram (CELEXA) 20 MG tablet Take by mouth.  . finasteride (PROSCAR) 5 MG tablet   . metoprolol-hydrochlorothiazide (LOPRESSOR HCT) 50-25 MG tablet   . potassium chloride SA (K-DUR,KLOR-CON) 20 MEQ tablet Take by mouth.  . tamsulosin (FLOMAX) 0.4 MG CAPS capsule Take by mouth.    Past Medical History:  Diagnosis Date  . Hyperlipidemia   . Hypertension     No past surgical history on file.  Social History Social History  Substance Use Topics  . Smoking status: Former Research scientist (life sciences)  . Smokeless tobacco: Never Used  . Alcohol use Yes    Family History No family history on file. No family history of bleeding/clotting disorders, porphyria or autoimmune disease   Allergies  Allergen Reactions  . Lisinopril Swelling     REVIEW OF SYSTEMS (Negative unless checked)  Constitutional: [] Weight loss  [] Fever  [] Chills Cardiac: [] Chest pain   [] Chest pressure   [] Palpitations   [] Shortness of breath when laying flat   [] Shortness of breath with exertion. Vascular:  [] Pain in legs with walking    [] Pain in legs at rest  [] History of DVT   [] Phlebitis   [] Swelling in legs   [] Varicose veins   [] Non-healing ulcers Pulmonary:   [] Uses home oxygen   [] Productive cough   [] Hemoptysis   [] Wheeze  [] COPD   [] Asthma Neurologic:  [] Dizziness   [] Seizures   [] History of stroke   [] History of TIA  [] Aphasia   [] Vissual changes   [] Weakness or numbness in arm   [] Weakness or numbness in leg Musculoskeletal:   [] Joint swelling   [] Joint pain   [] Low back pain Hematologic:  [] Easy bruising  [] Easy bleeding   [] Hypercoagulable state   [] Anemic Gastrointestinal:  [] Diarrhea   [] Vomiting  [] Gastroesophageal reflux/heartburn   [] Difficulty swallowing. Genitourinary:  [] Chronic kidney disease   [] Difficult urination  [] Frequent urination   [] Blood in urine Skin:  [] Rashes   [] Ulcers  Psychological:  [] History of anxiety   []  History of major depression.  Physical Examination  Vitals:   08/18/16 0903  BP: (!) 154/73  Pulse: 70  Resp: 16  Weight: 257 lb (116.6 kg)  Height: 6\' 2"  (1.88 m)   Body mass index is 33 kg/m. Gen: WD/WN, NAD Head: /AT, No temporalis wasting.  Ear/Nose/Throat: Hearing grossly intact, nares w/o erythema or drainage, poor dentition Eyes: PER, EOMI, sclera nonicteric.  Neck: Supple, no masses.  No bruit or JVD.  Pulmonary:  Good air movement, clear to auscultation bilaterally, no use of  accessory muscles.  Cardiac: RRR, normal S1, S2, no Murmurs. Vascular:  Vessel Right Left  Radial Palpable Palpable  Ulnar Palpable Palpable  Brachial Palpable Palpable  Carotid Palpable Palpable  Femoral Palpable Palpable  Popliteal Palpable Palpable  PT Palpable Palpable  DP Palpable Palpable   Gastrointestinal: soft, non-distended. No guarding/no peritoneal signs.  Musculoskeletal: M/S 5/5 throughout.  No deformity or atrophy.  Neurologic: CN 2-12 intact. Pain and light touch intact in extremities.  Symmetrical.  Speech is fluent. Motor exam as listed above. Psychiatric:  Judgment intact, Mood & affect appropriate for pt's clinical situation. Dermatologic: No rashes or ulcers noted.  No changes consistent with cellulitis. Lymph : No Cervical lymphadenopathy, no lichenification or skin changes of chronic lymphedema.  CBC Lab Results  Component Value Date   WBC 12.3 (H) 01/29/2014   HGB 11.6 (L) 01/29/2014   HCT 36.0 (L) 01/29/2014   MCV 87 01/29/2014   PLT 136 (L) 01/29/2014    BMET    Component Value Date/Time   NA 141 01/29/2014 0516   K 3.7 01/29/2014 0516   CL 110 (H) 01/29/2014 0516   CO2 25 01/29/2014 0516   GLUCOSE 84 01/29/2014 0516   BUN 20 (H) 01/29/2014 0516   CREATININE 1.21 01/29/2014 0516   CALCIUM 8.0 (L) 01/29/2014 0516   GFRNONAA 59 (L) 01/29/2014 0516   GFRAA >60 01/29/2014 0516   CrCl cannot be calculated (Patient's most recent lab result is older than the maximum 21 days allowed.).  COAG Lab Results  Component Value Date   INR 0.9 01/27/2014   INR 1.0 08/26/2012    Radiology US Soft Tissue Head And Neck  Result Date: 08/08/2016 CLINICAL DATA:  75 year old male with a history goiter EXAM: THYROID ULTRASOUND TECHNIQUE: Ultrasound examination of the thyroid gland and adjacent soft tissues was performed. COMPARISON:  None. FINDINGS: Parenchymal Echotexture: Moderately heterogenous Estimated total number of nodules >/= 1 cm: 3 Number of spongiform nodules >/=  2 cm not described below (TR1): 0 Number of mixed cystic and solid nodules >/= 1.5 cm not described below (TR2): 0 _________________________________________________________ Isthmus: 0.9 cm No discrete nodules are identified within the thyroid isthmus. _________________________________________________________ Right lobe: 4.8 cm x 1.8 cm x 1.9 cm Tiny hypoechoic/cystic changes within the right thyroid, which do not meet criteria for surveillance or biopsy. _________________________________________________________ Left lobe: 6.8 cm x 3.1 cm x 3.6 cm Nodule # 1: Location: Left;  Superior Size: 1.0 cm x 0.8 cm x 0.6 cm Composition: mixed cystic and solid (1) Echogenicity: isoechoic (1) Shape: not taller-than-wide (0) Margins: smooth (0) Echogenic foci: none (0) ACR TI-RADS total points: 2. ACR TI-RADS risk category: TR2 (2 points). ACR TI-RADS recommendations: Nodule does not meet criteria for surveillance or biopsy Nodule # 2: Location: Left; Superior Size: 1.3 cm x 0.8 cm x 1.0 cm Composition: mixed cystic and solid (1) Echogenicity: isoechoic (1) Shape: not taller-than-wide (0) Margins: ill-defined (0) Echogenic foci: none (0) ACR TI-RADS total points: 2. ACR TI-RADS risk category: TR2 (2 points). ACR TI-RADS recommendations: Nodule does not meet criteria for surveillance or biopsy Nodule # 3: Location: Left; Inferior Size: 3.9 cm x 2.6 cm x 2.9 cm Composition: solid/almost completely solid (2) Echogenicity: isoechoic (1) Shape: not taller-than-wide (0) Margins: ill-defined (0) Echogenic foci: none (0) ACR TI-RADS total points: 3. ACR TI-RADS risk category: TR3 (3 points). ACR TI-RADS recommendations: Nodule meets criteria for biopsy IMPRESSION: Multiple bilateral thyroid nodules, the majority of which do not meet criteria for surveillance or biopsy. The  inferior left thyroid nodule meets criteria for biopsy, as designated by the newly established ACR TI-RADS criteria, and referral for biopsy is recommended. Recommendations follow those established by the new ACR TI-RADS criteria (J Am Coll Radiol A9880051). Signed, Dulcy Fanny. Earleen Newport, DO [facility] Radiology Electronically Signed   By: Corrie Mckusick D.O.   On: 08/08/2016 13:19   [place] Retroperitoneal Comp  Result Date: 08/08/2016 CLINICAL DATA:  AAA. EXAM: ULTRASOUND RETROPERITONEAL COMPLETE TECHNIQUE: Ultrasound examination of the abdominal aorta was performed to evaluate for abdominal aortic aneurysm. The common iliac arteries, IVC, and kidneys were also evaluated. COMPARISON:   02/04/2015. FINDINGS: Abdominal Aorta Mild aneurysmal dilatation 3.2 cm. Maximum AP Diameter:  3.2 cm Maximum TRV Diameter: 2.8 cm Right Common Iliac Artery Ectasia to 1.4 cm Left Common Iliac Artery Ectasia to 1.4 cm IVC No abnormality visualized. Right Kidney Length: 12.9 cm Echogenicity within normal limits. No mass or hydronephrosis visualized. Left Kidney Length: 12.1 cm Echogenicity within normal limits. No hydronephrosis visualized. 2.0 cm simple cyst. IMPRESSION: 1. Small 3.2 cm abdominal aortic aneurysm . Recommend followup by ultrasound in 3 years. This recommendation follows ACR consensus guidelines: White Paper of the ACR Incidental Findings Committee II on Vascular Findings. J Am Coll Radiol 2013; 10:789-794. 2. 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 Vascular surgery follow-up should be considered. Electronically Signed   By: Marcello Moores  Register   On: 08/08/2016 11:09    Assessment/Plan 1. AAA (abdominal aortic aneurysm) without rupture (HCC) No surgery or intervention at this time. The patient has an asymptomatic abdominal aortic aneurysm that is less than 4 cm in maximal diameter.  I have discussed the natural history of abdominal aortic aneurysm and the small risk of rupture for aneurysm less than 5 cm in size.  However, as these small aneurysms tend to enlarge over time, continued surveillance with ultrasound or CT scan is mandatory.  I have also discussed optimizing medical management with hypertension and lipid control and the importance of abstinence from tobacco.  The patient is also encouraged to exercise a minimum of 30 minutes 4 times a week.  Should the patient develop new onset abdominal or back pain or signs of peripheral embolization they are instructed to seek medical attention immediately and to alert the physician providing care that they have an aneurysm.  The patient voices their understanding. The patient will return in 12 months with an aortic duplex.  - VAS US  AORTA/IVC/ILIACS; Future  2. Essential hypertension Continue antihypertensive medications as already ordered, these medications have been reviewed and there are no changes at this time.   3. Benign prostatic hyperplasia with urinary hesitancy Continue Proscar    Hortencia Pilar, MD  08/18/2016 11:29 AM

## 2016-08-30 ENCOUNTER — Other Ambulatory Visit: Payer: Self-pay | Admitting: Otolaryngology

## 2016-08-30 DIAGNOSIS — E041 Nontoxic single thyroid nodule: Secondary | ICD-10-CM

## 2016-09-05 ENCOUNTER — Ambulatory Visit
Admission: RE | Admit: 2016-09-05 | Discharge: 2016-09-05 | Disposition: A | Discharge status: Home / Self Care | Payer: Medicare HMO | Source: Ambulatory Visit | Attending: Otolaryngology | Admitting: Otolaryngology

## 2016-09-05 DIAGNOSIS — E041 Nontoxic single thyroid nodule: Secondary | ICD-10-CM | POA: Diagnosis not present

## 2016-09-06 LAB — CYTOLOGY - NON PAP

## 2016-09-08 ENCOUNTER — Other Ambulatory Visit: Payer: Self-pay | Admitting: Otolaryngology

## 2016-09-08 DIAGNOSIS — E041 Nontoxic single thyroid nodule: Secondary | ICD-10-CM

## 2017-06-20 DIAGNOSIS — R739 Hyperglycemia, unspecified: Secondary | ICD-10-CM | POA: Insufficient documentation

## 2017-08-28 ENCOUNTER — Ambulatory Visit (INDEPENDENT_AMBULATORY_CARE_PROVIDER_SITE_OTHER): Payer: Medicare HMO | Admitting: Vascular Surgery

## 2017-08-28 ENCOUNTER — Encounter (INDEPENDENT_AMBULATORY_CARE_PROVIDER_SITE_OTHER): Payer: Medicare HMO

## 2017-09-08 ENCOUNTER — Ambulatory Visit: Payer: Medicare HMO

## 2017-09-28 ENCOUNTER — Ambulatory Visit (INDEPENDENT_AMBULATORY_CARE_PROVIDER_SITE_OTHER): Payer: Medicare HMO | Admitting: Vascular Surgery

## 2017-09-28 ENCOUNTER — Ambulatory Visit (INDEPENDENT_AMBULATORY_CARE_PROVIDER_SITE_OTHER): Payer: Medicare HMO

## 2017-09-28 ENCOUNTER — Encounter (INDEPENDENT_AMBULATORY_CARE_PROVIDER_SITE_OTHER): Payer: Self-pay | Admitting: Vascular Surgery

## 2017-09-28 VITALS — BP 142/74 | HR 60 | Resp 16 | Ht 72.0 in | Wt 255.0 lb

## 2017-09-28 DIAGNOSIS — I714 Abdominal aortic aneurysm, without rupture, unspecified: Secondary | ICD-10-CM

## 2017-09-28 DIAGNOSIS — I10 Essential (primary) hypertension: Secondary | ICD-10-CM

## 2017-09-30 ENCOUNTER — Encounter (INDEPENDENT_AMBULATORY_CARE_PROVIDER_SITE_OTHER): Payer: Self-pay | Admitting: Vascular Surgery

## 2017-09-30 NOTE — Progress Notes (Signed)
MRN : [id number]  [name] is a 77 y.o. (23-Jun-1941) male who presents with chief complaint of  Chief Complaint  Patient presents with  . Follow-up    1 year aoorta iliac f/u  .  History of Present Illness: The patient returns to the office for surveillance of a known abdominal aortic aneurysm. Patient denies abdominal pain or back pain, no other abdominal complaints. No changes suggesting embolic episodes.   There have been no interval changes in the patient's overall health care since his last visit.  Patient denies amaurosis fugax or TIA symptoms. There is no history of claudication or rest pain symptoms of the lower extremities. The patient denies angina or shortness of breath.     Current Meds  Medication Sig  . amLODipine (NORVASC) 2.5 MG tablet Take by mouth.  Marland Kitchen aspirin EC 81 MG tablet Take by mouth.  . finasteride (PROSCAR) 5 MG tablet   . metoprolol tartrate (LOPRESSOR) 50 MG tablet Take by mouth.  . metoprolol-hydrochlorothiazide (LOPRESSOR HCT) 50-25 MG tablet   . tamsulosin (FLOMAX) 0.4 MG CAPS capsule Take by mouth.    Past Medical History:  Diagnosis Date  . Hyperlipidemia   . Hypertension     Past Surgical History:  Procedure Laterality Date  . APPENDECTOMY      Social History Social History   Tobacco Use  . Smoking status: Former Smoker    Last attempt to quit: 09/05/1990    Years since quitting: 27.0  . Smokeless tobacco: Never Used  Substance Use Topics  . Alcohol use: Yes    Alcohol/week: 0.6 oz    Types: 1 Glasses of wine per week  . Drug use: No    Family History History reviewed. No pertinent family history.  Allergies  Allergen Reactions  . Lisinopril Swelling     REVIEW OF SYSTEMS (Negative unless checked)  Constitutional: [] Weight loss  [] Fever  [] Chills Cardiac: [] Chest pain   [] Chest pressure   [] Palpitations   [] Shortness of breath when laying flat   [] Shortness of breath with exertion. Vascular:  [] Pain in legs with  walking   [] Pain in legs at rest  [] History of DVT   [] Phlebitis   [] Swelling in legs   [] Varicose veins   [] Non-healing ulcers Pulmonary:   [] Uses home oxygen   [] Productive cough   [] Hemoptysis   [] Wheeze  [] COPD   [] Asthma Neurologic:  [] Dizziness   [] Seizures   [] History of stroke   [] History of TIA  [] Aphasia   [] Vissual changes   [] Weakness or numbness in arm   [] Weakness or numbness in leg Musculoskeletal:   [] Joint swelling   [] Joint pain   [] Low back pain Hematologic:  [] Easy bruising  [] Easy bleeding   [] Hypercoagulable state   [] Anemic Gastrointestinal:  [] Diarrhea   [] Vomiting  [] Gastroesophageal reflux/heartburn   [] Difficulty swallowing. Genitourinary:  [] Chronic kidney disease   [] Difficult urination  [] Frequent urination   [] Blood in urine Skin:  [] Rashes   [] Ulcers  Psychological:  [] History of anxiety   []  History of major depression.  Physical Examination  Vitals:   09/28/17 0851  BP: (!) 142/74  Pulse: 60  Resp: 16  Weight: 255 lb (115.7 kg)  Height: 6' (1.829 m)   Body mass index is 34.58 kg/m. Gen: WD/WN, NAD Head: [place]/AT, No temporalis wasting.  Ear/Nose/Throat: Hearing grossly intact, nares w/o erythema or drainage Eyes: PER, EOMI, sclera nonicteric.  Neck: Supple, no large masses.   Pulmonary:  Good air movement, no audible  wheezing bilaterally, no use of accessory muscles.  Cardiac: RRR, no JVD Vascular:  Vessel Right Left  Radial Palpable Palpable  PT Palpable Palpable  DP Palpable Palpable  Gastrointestinal: Non-distended. No guarding/no peritoneal signs.  Musculoskeletal: M/S 5/5 throughout.  No deformity or atrophy.  Neurologic: CN 2-12 intact. Symmetrical.  Speech is fluent. Motor exam as listed above. Psychiatric: Judgment intact, Mood & affect appropriate for pt's clinical situation. Dermatologic: No rashes or ulcers noted.  No changes consistent with cellulitis. Lymph : No lichenification or skin changes of chronic lymphedema.  CBC Lab  Results  Component Value Date   WBC 12.3 (H) 01/29/2014   HGB 11.6 (L) 01/29/2014   HCT 36.0 (L) 01/29/2014   MCV 87 01/29/2014   PLT 136 (L) 01/29/2014    BMET    Component Value Date/Time   NA 141 01/29/2014 0516   K 3.7 01/29/2014 0516   CL 110 (H) 01/29/2014 0516   CO2 25 01/29/2014 0516   GLUCOSE 84 01/29/2014 0516   BUN 20 (H) 01/29/2014 0516   CREATININE 1.21 01/29/2014 0516   CALCIUM 8.0 (L) 01/29/2014 0516   GFRNONAA 59 (L) 01/29/2014 0516   GFRAA >60 01/29/2014 0516   CrCl cannot be calculated (Patient's most recent lab result is older than the maximum 21 days allowed.).  COAG Lab Results  Component Value Date   INR 0.9 01/27/2014   INR 1.0 08/26/2012    Radiology No results found.  Assessment/Plan 1. AAA (abdominal aortic aneurysm) without rupture (HCC) No surgery or intervention at this time. The patient has an asymptomatic abdominal aortic aneurysm that is less than 4 cm in maximal diameter.  I have discussed the natural history of abdominal aortic aneurysm and the small risk of rupture for aneurysm less than 5 cm in size.  However, as these small aneurysms tend to enlarge over time, continued surveillance with ultrasound or CT scan is mandatory.  I have also discussed optimizing medical management with hypertension and lipid control and the importance of abstinence from tobacco.  The patient is also encouraged to exercise a minimum of 30 minutes 4 times a week.  Should the patient develop new onset abdominal or back pain or signs of peripheral embolization they are instructed to seek medical attention immediately and to alert the physician providing care that they have an aneurysm.  The patient voices their understanding. The patient will return in 24 months with an aortic duplex.  - VAS US AORTA/IVC/ILIACS; Future  2. Essential hypertension Continue antihypertensive medications as already ordered, these medications have been reviewed and there are no  changes at this time.     Hortencia Pilar, MD  09/30/2017 8:40 PM

## 2018-08-05 ENCOUNTER — Emergency Department
Admission: EM | Admit: 2018-08-05 | Discharge: 2018-08-05 | Disposition: A | Discharge status: Home / Self Care | Payer: Medicare HMO | Attending: Emergency Medicine | Admitting: Emergency Medicine

## 2018-08-05 ENCOUNTER — Encounter: Payer: Self-pay | Admitting: Emergency Medicine

## 2018-08-05 ENCOUNTER — Other Ambulatory Visit: Payer: Self-pay

## 2018-08-05 ENCOUNTER — Emergency Department: Payer: Medicare HMO

## 2018-08-05 DIAGNOSIS — Z79899 Other long term (current) drug therapy: Secondary | ICD-10-CM | POA: Diagnosis not present

## 2018-08-05 DIAGNOSIS — R42 Dizziness and giddiness: Secondary | ICD-10-CM

## 2018-08-05 DIAGNOSIS — Z7982 Long term (current) use of aspirin: Secondary | ICD-10-CM | POA: Insufficient documentation

## 2018-08-05 DIAGNOSIS — I10 Essential (primary) hypertension: Secondary | ICD-10-CM | POA: Diagnosis not present

## 2018-08-05 DIAGNOSIS — Z87891 Personal history of nicotine dependence: Secondary | ICD-10-CM | POA: Insufficient documentation

## 2018-08-05 DIAGNOSIS — H81399 Other peripheral vertigo, unspecified ear: Secondary | ICD-10-CM | POA: Insufficient documentation

## 2018-08-05 LAB — CBC
HCT: 40.8 % (ref 39.0–52.0)
Hemoglobin: 13.6 g/dL (ref 13.0–17.0)
MCH: 28.8 pg (ref 26.0–34.0)
MCHC: 33.3 g/dL (ref 30.0–36.0)
MCV: 86.3 fL (ref 80.0–100.0)
Platelets: 226 10*3/uL (ref 150–400)
RBC: 4.73 MIL/uL (ref 4.22–5.81)
RDW: 13.5 % (ref 11.5–15.5)
WBC: 4.5 10*3/uL (ref 4.0–10.5)
nRBC: 0 % (ref 0.0–0.2)

## 2018-08-05 LAB — TROPONIN I: Troponin I: <0.03 ng/mL (ref <0.03)

## 2018-08-05 LAB — BASIC METABOLIC PANEL
Anion gap: 9 (ref 5–15)
BUN: 13 mg/dL (ref 8–23)
CO2: 28 mmol/L (ref 22–32)
Calcium: 9.4 mg/dL (ref 8.9–10.3)
Chloride: 102 mmol/L (ref 98–111)
Creatinine, Ser: 0.94 mg/dL (ref 0.61–1.24)
GFR calc non Af Amer: >60 mL/min (ref >60)
Glucose, Bld: 104 mg/dL — ABNORMAL HIGH (ref 70–99)
Potassium: 4.2 mmol/L (ref 3.5–5.1)
Sodium: 139 mmol/L (ref 135–145)

## 2018-08-05 MED ORDER — MECLIZINE HCL 25 MG PO TABS
25.0000 mg | ORAL_TABLET | Freq: Once | ORAL | Status: AC
  Administered 2018-08-05: 25 mg via ORAL
  Filled 2018-08-05: qty 1

## 2018-08-05 MED ORDER — MECLIZINE HCL 25 MG PO TABS: 25.0000 mg | ORAL_TABLET | Freq: Three times a day (TID) | ORAL | 0 refills | Status: AC | PRN

## 2018-08-05 NOTE — ED Provider Notes (Signed)
[facility] Emergency Department Provider Note  ___________________________________________   First MD Initiated Contact with Patient 08/05/18 1638     (approximate)  I have reviewed the triage vital signs and the nursing notes.   HISTORY  Chief Complaint Dizziness   HPI [name] is a 77 y.o. male history of hypertension, hyperlipidemia and peripheral vertigo who was presented emergency department vertigo over the past 3 to 4 days.  He says that he has also had a runny nose this morning but denies any ear pain, pressure or ringing in his ears, bilaterally.  He says he went to urgent care yesterday and was given 2.5 mg of amlodipine to be taken daily as well as pantoprazole for reflux.  He states that he is still feeling vertiginous when walking or moving his head from side to side.  However, the symptoms dissipate when he is sitting or still.  He says that he had a decreased appetite today but was able to eat with his family prior to coming to the emergency department.  He is not reporting any weakness or numbness.  Says that he has similar episode previously.  Also reporting rhinorrhea that started this morning.  Patient states that he has elevated blood pressure but that his blood pressure is only increased over the past several days since his symptoms began.   Past Medical History:  Diagnosis Date  . Hyperlipidemia   . Hypertension     Patient Active Problem List   Diagnosis Date Noted  . AAA (abdominal aortic aneurysm) without rupture ([place]) 08/18/2016  . Essential hypertension 08/18/2016  . BPH (benign prostatic hyperplasia) 08/18/2016    Past Surgical History:  Procedure Laterality Date  . APPENDECTOMY      Prior to Admission medications   Medication Sig Start Date End Date Taking? Authorizing Provider  aspirin EC 81 MG tablet Take 81 mg by mouth daily.  07/21/16  Yes [provider]  atorvastatin (LIPITOR) 20 MG tablet Take 20 mg  by mouth daily. 05/09/18 05/09/19 Yes [provider]  finasteride (PROSCAR) 5 MG tablet Take 5 mg by mouth daily.    Yes [provider]  metoprolol tartrate (LOPRESSOR) 50 MG tablet Take 50 mg by mouth 2 (two) times daily.    Yes [provider]  omeprazole (PRILOSEC) 40 MG capsule Take 40 mg by mouth daily.   Yes [provider]  spironolactone (ALDACTONE) 25 MG tablet Take 25 mg by mouth daily. 06/08/18 06/08/19 Yes [provider]  tamsulosin (FLOMAX) 0.4 MG CAPS capsule Take 0.4 mg by mouth daily.  07/20/16  Yes [provider]    Allergies Amlodipine and Lisinopril  History reviewed. No pertinent family history.  Social History Social History   Tobacco Use  . Smoking status: Former Smoker    Last attempt to quit: 09/05/1990    Years since quitting: 27.9  . Smokeless tobacco: Never Used  Substance Use Topics  . Alcohol use: Yes    Alcohol/week: 1.0 standard drinks    Types: 1 Glasses of wine per week  . Drug use: No    Review of Systems  Constitutional: No fever/chills Eyes: No visual changes. ENT: No sore throat. Cardiovascular: Denies chest pain. Respiratory: Denies shortness of breath. Gastrointestinal: No abdominal pain.  No nausea, no vomiting.  No diarrhea.  No constipation. Genitourinary: Negative for dysuria. Musculoskeletal: Negative for back pain. Skin: Negative for rash. Neurological: Negative for headaches, focal weakness or numbness.   ____________________________________________  PHYSICAL EXAM:  VITAL SIGNS: ED Triage Vitals  Enc Vitals Group     BP 08/05/18 1323 (!) 206/80     Pulse Rate 08/05/18 1323 (!) 58     Resp 08/05/18 1323 14     Temp 08/05/18 1323 98.2 F (36.8 C)     Temp Source 08/05/18 1323 Oral     SpO2 08/05/18 1323 95 %     Weight 08/05/18 1327 267 lb (121.1 kg)     Height 08/05/18 1327 6\' 1"  (1.854 m)     Head Circumference --      Peak Flow --      Pain Score 08/05/18  1327 0     Pain Loc --      Pain Edu? --      Excl. in [place]? --     Constitutional: Alert and oriented. Well appearing and in no acute distress. Eyes: Conjunctivae are normal.  Head: Atraumatic.  Normal TMs bilaterally. Nose: No congestion/rhinnorhea. Mouth/Throat: Mucous membranes are moist.  Neck: No stridor.   Cardiovascular: Normal rate, regular rhythm. Grossly normal heart sounds.   Respiratory: Normal respiratory effort.  No retractions. Lungs CTAB. Gastrointestinal: Soft and nontender. No distention Musculoskeletal: No lower extremity tenderness nor edema.  No joint effusions. Neurologic:  Normal speech and language. No gross focal neurologic deficits are appreciated.  No ataxia on finger-to-nose testing.  No nystagmus. Skin:  Skin is warm, dry and intact. No rash noted. Psychiatric: Mood and affect are normal. Speech and behavior are normal.  ____________________________________________   LABS (all labs ordered are listed, but only abnormal results are displayed)  Labs Reviewed  BASIC METABOLIC PANEL - Abnormal; Notable for the following components:      Result Value   Glucose, Bld 104 (*)    All other components within normal limits  CBC  TROPONIN I  URINALYSIS, COMPLETE (UACMP) WITH MICROSCOPIC  CBG MONITORING, ED   ____________________________________________  EKG  ED ECG REPORT I, Doran Stabler, the attending physician, personally viewed and interpreted this ECG.   Date: 08/05/2018  EKG Time: 1328  Rate: 54  Rhythm: sinus bradycardia  Axis: Normal  Intervals:none  ST&T Change: T wave inversions in 3 and aVF as well as V3 through V6 which are new from previous.  However, the last EKG was seen from 2013.  Changes may also be consistent with LVH.  ____________________________________________  RADIOLOGY  CT of the head without acute intracranial abnormalities.  Age-appropriate volume loss  noted. ____________________________________________   PROCEDURES  Procedure(s) performed:   Procedures  Critical Care performed:   ____________________________________________   INITIAL IMPRESSION / ASSESSMENT AND PLAN / ED COURSE  Pertinent labs & imaging results that were available during my care of the patient were reviewed by me and considered in my medical decision making (see chart for details).  DDX: Uncontrolled hypertension, peripheral vertigo, central vertigo, electrolyte abnormality, symptomatic hypertension, intracranial hemorrhage As part of my medical decision making, I reviewed the following data within the [place] Notes from prior outpatient visits  ----------------------------------------- 6:14 PM on 08/05/2018 -----------------------------------------  Patient states that he is feeling improved at this time.  Able to ambulate without any vertiginous symptoms.  Patient will stop taking his amlodipine.  Will be discharged with meclizine and will follow-up with his primary care doctor for a blood pressure recheck.  Troponin also negative.  Likely EKG changes secondary to prolonged exposure to hypertension. ____________________________________________   FINAL CLINICAL IMPRESSION(S) / ED DIAGNOSES  Vertigo.  NEW MEDICATIONS STARTED DURING THIS VISIT:  New Prescriptions   No medications on file     Note:  This document was prepared using Dragon voice recognition software and may include unintentional dictation errors.     Orbie Pyo, MD 08/05/18 [phone]

## 2018-08-05 NOTE — ED Triage Notes (Signed)
Pt to ED with c/o of dizziness that started yesterday. Pt denies chest pain and N/V but states unable to eat since yesterday.  Pt states BP at home "high".

## 2018-08-05 NOTE — ED Notes (Signed)
ED Provider at bedside. 

## 2018-08-08 DIAGNOSIS — R42 Dizziness and giddiness: Secondary | ICD-10-CM | POA: Insufficient documentation

## 2018-09-27 ENCOUNTER — Encounter (INDEPENDENT_AMBULATORY_CARE_PROVIDER_SITE_OTHER): Payer: Medicare HMO

## 2018-12-05 DIAGNOSIS — K219 Gastro-esophageal reflux disease without esophagitis: Secondary | ICD-10-CM | POA: Insufficient documentation

## 2019-01-28 ENCOUNTER — Other Ambulatory Visit: Payer: Self-pay

## 2019-01-28 ENCOUNTER — Ambulatory Visit
Admission: EM | Admit: 2019-01-28 | Discharge: 2019-01-28 | Disposition: A | Discharge status: Home / Self Care | Payer: Medicare HMO | Attending: Family Medicine | Admitting: Family Medicine

## 2019-01-28 ENCOUNTER — Ambulatory Visit (INDEPENDENT_AMBULATORY_CARE_PROVIDER_SITE_OTHER): Payer: Medicare HMO

## 2019-01-28 DIAGNOSIS — S6010XA Contusion of unspecified finger with damage to nail, initial encounter: Secondary | ICD-10-CM | POA: Diagnosis not present

## 2019-01-28 DIAGNOSIS — S60012A Contusion of left thumb without damage to nail, initial encounter: Secondary | ICD-10-CM | POA: Diagnosis not present

## 2019-01-28 DIAGNOSIS — W230XXA Caught, crushed, jammed, or pinched between moving objects, initial encounter: Secondary | ICD-10-CM | POA: Diagnosis not present

## 2019-01-28 DIAGNOSIS — I10 Essential (primary) hypertension: Secondary | ICD-10-CM

## 2019-01-28 MED ORDER — HYDROCODONE-ACETAMINOPHEN 5-325 MG PO TABS: ORAL_TABLET | ORAL | 0 refills | Status: AC

## 2019-01-28 NOTE — ED Provider Notes (Signed)
[facility]    CSN: [id number] Arrival date & time: 01/28/19  1132     History   Chief Complaint Chief Complaint  Patient presents with  . Hand Pain    left thumb    HPI [name] is a 78 y.o. male.   78 yo male with a c/o left thumb pain after he shut it in the car door yesterday. States there's blood under the nail and having throbbing pain.    Hand Pain     Past Medical History:  Diagnosis Date  . Hyperlipidemia   . Hypertension     Patient Active Problem List   Diagnosis Date Noted  . AAA (abdominal aortic aneurysm) without rupture ([place]) 08/18/2016  . Essential hypertension 08/18/2016  . BPH (benign prostatic hyperplasia) 08/18/2016    Past Surgical History:  Procedure Laterality Date  . APPENDECTOMY         Home Medications    Prior to Admission medications   Medication Sig Start Date End Date Taking? Authorizing Provider  aspirin EC 81 MG tablet Take 81 mg by mouth daily.  07/21/16  Yes [provider]  atorvastatin (LIPITOR) 20 MG tablet Take 20 mg by mouth daily. 05/09/18 05/09/19 Yes [provider]  finasteride (PROSCAR) 5 MG tablet Take 5 mg by mouth daily.    Yes [provider]  meclizine (ANTIVERT) 25 MG tablet Take 1 tablet (25 mg total) by mouth 3 (three) times daily as needed for dizziness. 08/05/18  Yes Schaevitz, Randall An, MD  metoprolol tartrate (LOPRESSOR) 50 MG tablet Take 50 mg by mouth 2 (two) times daily.    Yes [provider]  omeprazole (PRILOSEC) 40 MG capsule Take 40 mg by mouth daily.   Yes [provider]  spironolactone (ALDACTONE) 25 MG tablet Take 25 mg by mouth daily. 06/08/18 06/08/19 Yes [provider]  tamsulosin (FLOMAX) 0.4 MG CAPS capsule Take 0.4 mg by mouth daily.  07/20/16  Yes [provider]  HYDROcodone-acetaminophen (NORCO/VICODIN) 5-325 MG tablet 1-2 tabs po bid prn 01/28/19   Norval Gable, MD    Family History History  reviewed. No pertinent family history.  Social History Social History   Tobacco Use  . Smoking status: Former Smoker    Last attempt to quit: 09/05/1990    Years since quitting: 28.4  . Smokeless tobacco: Never Used  Substance Use Topics  . Alcohol use: Yes    Alcohol/week: 1.0 standard drinks    Types: 1 Glasses of wine per week  . Drug use: No     Allergies   Amlodipine and Lisinopril   Review of Systems Review of Systems   Physical Exam Triage Vital Signs ED Triage Vitals  Enc Vitals Group     BP 01/28/19 1217 (!) 181/79     Pulse Rate 01/28/19 1217 (!) 58     Resp 01/28/19 1217 16     Temp 01/28/19 1217 98.1 F (36.7 C)     Temp Source 01/28/19 1217 Oral     SpO2 01/28/19 1217 100 %     Weight 01/28/19 1215 270 lb (122.5 kg)     Height 01/28/19 1215 6\' 1"  (1.854 m)     Head Circumference --      Peak Flow --      Pain Score 01/28/19 1215 10     Pain Loc --      Pain Edu? --      Excl. in [place]? --  No data found.  Updated Vital Signs BP (!) 181/79 (BP Location: Left Arm)   Pulse (!) 58   Temp 98.1 F (36.7 C) (Oral)   Resp 16   Ht 6\' 1"  (1.854 m)   Wt 122.5 kg   SpO2 100%   BMI 35.62 kg/m   Visual Acuity Right Eye Distance:   Left Eye Distance:   Bilateral Distance:    Right Eye Near:   Left Eye Near:    Bilateral Near:     Physical Exam Vitals signs and nursing note reviewed.  Constitutional:      General: He is not in acute distress.    Appearance: He is not toxic-appearing or diaphoretic.  Musculoskeletal:     Left hand: He exhibits tenderness (blood under the nail with tenderness to palpation). He exhibits normal range of motion, no bony tenderness, normal two-point discrimination, normal capillary refill, no deformity, no laceration and no swelling. Normal sensation noted. Normal strength noted.  Neurological:     Mental Status: He is alert.      UC Treatments / Results  Labs (all labs ordered are listed, but only abnormal  results are displayed) Labs Reviewed - No data to display  EKG None  Radiology Dg Finger Thumb Left  Result Date: 01/28/2019 CLINICAL DATA:  Slammed thumb in car door yesterday. Distal pain and nailbed discoloration. EXAM: LEFT THUMB 2+V COMPARISON:  None. FINDINGS: The AP view is slightly rotated. No definite acute fracture or dislocation identified. There are mild degenerative changes at the interphalangeal joint. There is a linear calcification along the head of the proximal phalanx dorsally which appears nonacute. Mild soft tissue swelling is present. IMPRESSION: Soft tissue swelling without evidence of acute fracture or dislocation. Electronically Signed   By: Richardean Sale M.D.   On: 01/28/2019 12:45    Procedures Join Aspiration/Injection Date/Time: 01/28/2019 2:27 PM Performed by: Norval Gable, MD Authorized by: Norval Gable, MD   Consent:    Consent obtained:  Verbal   Consent given by:  Patient   Risks discussed:  Bleeding, infection, nerve damage, pain and poor cosmetic result   Alternatives discussed:  No treatment Location:    Location: left thumb. Anesthesia (see MAR for exact dosages):    Anesthesia method:  None Procedure details:    Preparation: Patient was prepped and draped in usual sterile fashion     Aspirate amount:  Trephination of subungual hematoma performed with cautery with relief of pressure    Aspirate characteristics:  Bloody   Steroid injected: no     Specimen collected: no   Post-procedure details:    Dressing:  Adhesive bandage   Patient tolerance of procedure:  Tolerated well, no immediate complications   (including critical care time)  Medications Ordered in UC Medications - No data to display  Initial Impression / Assessment and Plan / UC Course  I have reviewed the triage vital signs and the nursing notes.  Pertinent labs & imaging results that were available during my care of the patient were reviewed by me and considered in my  medical decision making (see chart for details).      Final Clinical Impressions(s) / UC Diagnoses   Final diagnoses:  Contusion of left thumb without damage to nail, initial encounter  Subungual hematoma of digit of hand, initial encounter    ED Prescriptions    Medication Sig Dispense Auth. Provider   HYDROcodone-acetaminophen (NORCO/VICODIN) 5-325 MG tablet 1-2 tabs po bid prn 6 tablet Norval Gable, MD  1. x-ray result (negative for fracture) and diagnosis reviewed with patient 2. Procedure as per note above 3. rx as per orders above; reviewed possible side effects, interactions, risks and benefits  4. Recommend supportive treatment with wound care 5. Follow-up prn if symptoms worsen or don't improve   Controlled Substance Prescriptions [place] Controlled Substance Registry consulted? Not Applicable   Norval Gable, MD 01/28/19 1432

## 2019-01-28 NOTE — ED Triage Notes (Signed)
Patient complains of left thumb pain after he shut it in a car door yesterday. Patient states that pain has been constant and throbbing.

## 2019-03-22 ENCOUNTER — Other Ambulatory Visit (HOSPITAL_COMMUNITY): Payer: Self-pay | Admitting: Family Medicine

## 2019-03-22 ENCOUNTER — Other Ambulatory Visit: Payer: Self-pay | Admitting: Family Medicine

## 2019-03-22 DIAGNOSIS — R1011 Right upper quadrant pain: Secondary | ICD-10-CM

## 2019-03-26 ENCOUNTER — Other Ambulatory Visit: Payer: Self-pay

## 2019-03-26 ENCOUNTER — Ambulatory Visit
Admission: RE | Admit: 2019-03-26 | Discharge: 2019-03-26 | Disposition: A | Discharge status: Home / Self Care | Payer: Medicare HMO | Source: Ambulatory Visit | Attending: Family Medicine | Admitting: Family Medicine

## 2019-03-26 DIAGNOSIS — R1011 Right upper quadrant pain: Secondary | ICD-10-CM | POA: Insufficient documentation

## 2019-09-25 ENCOUNTER — Other Ambulatory Visit (INDEPENDENT_AMBULATORY_CARE_PROVIDER_SITE_OTHER): Payer: Self-pay | Admitting: Vascular Surgery

## 2019-09-25 DIAGNOSIS — I714 Abdominal aortic aneurysm, without rupture, unspecified: Secondary | ICD-10-CM

## 2019-09-26 ENCOUNTER — Ambulatory Visit (INDEPENDENT_AMBULATORY_CARE_PROVIDER_SITE_OTHER): Payer: Medicare HMO | Admitting: Vascular Surgery

## 2019-09-26 ENCOUNTER — Other Ambulatory Visit (INDEPENDENT_AMBULATORY_CARE_PROVIDER_SITE_OTHER): Payer: Medicare HMO

## 2019-12-16 ENCOUNTER — Emergency Department: Payer: Medicare HMO

## 2019-12-16 ENCOUNTER — Encounter: Payer: Self-pay | Admitting: Emergency Medicine

## 2019-12-16 ENCOUNTER — Emergency Department
Admission: EM | Admit: 2019-12-16 | Discharge: 2019-12-16 | Disposition: A | Discharge status: Home / Self Care | Payer: Medicare HMO | Attending: Emergency Medicine | Admitting: Emergency Medicine

## 2019-12-16 DIAGNOSIS — R531 Weakness: Secondary | ICD-10-CM | POA: Insufficient documentation

## 2019-12-16 DIAGNOSIS — R0602 Shortness of breath: Secondary | ICD-10-CM | POA: Diagnosis not present

## 2019-12-16 DIAGNOSIS — I10 Essential (primary) hypertension: Secondary | ICD-10-CM | POA: Diagnosis not present

## 2019-12-16 DIAGNOSIS — Z87891 Personal history of nicotine dependence: Secondary | ICD-10-CM | POA: Diagnosis not present

## 2019-12-16 LAB — BASIC METABOLIC PANEL
Anion gap: 13 (ref 5–15)
BUN: 7 mg/dL — ABNORMAL LOW (ref 8–23)
CO2: 25 mmol/L (ref 22–32)
Calcium: 9 mg/dL (ref 8.9–10.3)
Chloride: 103 mmol/L (ref 98–111)
Creatinine, Ser: 0.91 mg/dL (ref 0.61–1.24)
GFR calc Af Amer: >60 mL/min (ref >60)
GFR calc non Af Amer: >60 mL/min (ref >60)
Glucose, Bld: 107 mg/dL — ABNORMAL HIGH (ref 70–99)
Potassium: 2.9 mmol/L — ABNORMAL LOW (ref 3.5–5.1)
Sodium: 141 mmol/L (ref 135–145)

## 2019-12-16 LAB — CBC WITH DIFFERENTIAL/PLATELET
Abs Immature Granulocytes: 0.01 10*3/uL (ref 0.00–0.07)
Basophils Absolute: 0 10*3/uL (ref 0.0–0.1)
Basophils Relative: 1 %
Eosinophils Absolute: 0.1 10*3/uL (ref 0.0–0.5)
Eosinophils Relative: 2 %
HCT: 38.1 % — ABNORMAL LOW (ref 39.0–52.0)
Hemoglobin: 12.6 g/dL — ABNORMAL LOW (ref 13.0–17.0)
Immature Granulocytes: 0 %
Lymphocytes Relative: 29 %
Lymphs Abs: 1.4 10*3/uL (ref 0.7–4.0)
MCH: 29.4 pg (ref 26.0–34.0)
MCHC: 33.1 g/dL (ref 30.0–36.0)
MCV: 88.8 fL (ref 80.0–100.0)
Monocytes Absolute: 0.3 10*3/uL (ref 0.1–1.0)
Monocytes Relative: 6 %
Neutro Abs: 3 10*3/uL (ref 1.7–7.7)
Neutrophils Relative %: 62 %
Platelets: 199 10*3/uL (ref 150–400)
RBC: 4.29 MIL/uL (ref 4.22–5.81)
RDW: 14 % (ref 11.5–15.5)
WBC: 4.8 10*3/uL (ref 4.0–10.5)
nRBC: 0 % (ref 0.0–0.2)

## 2019-12-16 LAB — TROPONIN I (HIGH SENSITIVITY)
Troponin I (High Sensitivity): 9 ng/L (ref <18)
Troponin I (High Sensitivity): 15 ng/L (ref <18)

## 2019-12-16 LAB — BRAIN NATRIURETIC PEPTIDE: B Natriuretic Peptide: 24 pg/mL (ref 0.0–100.0)

## 2019-12-16 MED ORDER — POTASSIUM CHLORIDE CRYS ER 20 MEQ PO TBCR
40.0000 meq | EXTENDED_RELEASE_TABLET | Freq: Once | ORAL | Status: AC
  Administered 2019-12-16: 40 meq via ORAL
  Filled 2019-12-16: qty 2

## 2019-12-16 NOTE — ED Triage Notes (Signed)
Pt to ED with c/o of weakness and SOB. Pt denies chest pain. Pt states started feeling "bad" after his PCP changed his BP medication last week.

## 2019-12-16 NOTE — ED Notes (Signed)
Pt and pt's daughter verbalized understanding of discharge instructions. NAD at this time. °

## 2019-12-16 NOTE — ED Provider Notes (Signed)
[facility] Emergency Department Provider Note       Time seen: ----------------------------------------- 10:23 AM on 12/16/2019 -----------------------------------------   I have reviewed the triage vital signs and the nursing notes.  HISTORY   Chief Complaint Weakness and Shortness of Breath    HPI [name] is a 79 y.o. male with a history of hyperlipidemia, hypertension, AAA who presents to the ED for weakness and feeling short of breath.  Patient denies feeling short of breath currently, states he has felt poorly since he has been started a new blood pressure medication.  He denies fevers, chills or other complaints.  Past Medical History:  Diagnosis Date  . Hyperlipidemia   . Hypertension     Patient Active Problem List   Diagnosis Date Noted  . AAA (abdominal aortic aneurysm) without rupture ([place]) 08/18/2016  . Essential hypertension 08/18/2016  . BPH (benign prostatic hyperplasia) 08/18/2016    Past Surgical History:  Procedure Laterality Date  . APPENDECTOMY      Allergies Amlodipine and Lisinopril  Social History Social History   Tobacco Use  . Smoking status: Former Smoker    Quit date: 09/05/1990    Years since quitting: 29.2  . Smokeless tobacco: Never Used  Substance Use Topics  . Alcohol use: Yes    Alcohol/week: 1.0 standard drinks    Types: 1 Glasses of wine per week  . Drug use: No    Review of Systems Constitutional: Negative for fever. Cardiovascular: Negative for chest pain. Respiratory: Positive for shortness of breath Gastrointestinal: Negative for abdominal pain, vomiting and diarrhea. Musculoskeletal: Negative for back pain. Skin: Negative for rash. Neurological: Negative for headaches, positive for weakness  All systems negative/normal/unremarkable except as stated in the HPI  ____________________________________________   PHYSICAL EXAM:  VITAL SIGNS: ED Triage Vitals [12/16/19 1021]  Enc  Vitals Group     BP      Pulse      Resp      Temp      Temp src      SpO2      Weight 270 lb 1 oz (122.5 kg)     Height 6\' 1"  (1.854 m)     Head Circumference      Peak Flow      Pain Score 0     Pain Loc      Pain Edu?      Excl. in [place]?     Constitutional: Alert and oriented. Well appearing and in no distress. Eyes: Conjunctivae are normal. Normal extraocular movements. ENT      Head: Normocephalic and atraumatic.      Nose: No congestion/rhinnorhea.      Mouth/Throat: Mucous membranes are moist.      Neck: No stridor. Cardiovascular: Rapid rate, regular rhythm. No murmurs, rubs, or gallops. Respiratory: Normal respiratory effort without tachypnea nor retractions. Breath sounds are clear and equal bilaterally. No wheezes/rales/rhonchi. Gastrointestinal: Soft and nontender. Normal bowel sounds Musculoskeletal: Nontender with normal range of motion in extremities. No lower extremity tenderness nor edema. Neurologic:  Normal speech and language. No gross focal neurologic deficits are appreciated.  Skin:  Skin is warm, dry and intact. No rash noted. Psychiatric: Mood and affect are normal. Speech and behavior are normal.  ____________________________________________  EKG: Interpreted by me.  Sinus tachycardia with rate of 117 bpm, left anterior fascicular block, normal QT  ____________________________________________  ED COURSE:  As part of my medical decision making, I reviewed the following data within the  electronic MEDICAL RECORD NUMBER History obtained from family if available, nursing notes, old chart and ekg, as well as notes from prior ED visits. Patient presented for weakness and shortness of breath, we will assess with labs and imaging as indicated at this time.   Procedures  [name] was evaluated in Emergency Department on 12/16/2019 for the symptoms described in the history of present illness. He was evaluated in the context of the global COVID-19 pandemic, which  necessitated consideration that the patient might be at risk for infection with the SARS-CoV-2 virus that causes COVID-19. Institutional protocols and algorithms that pertain to the evaluation of patients at risk for COVID-19 are in a state of rapid change based on information released by regulatory bodies including the CDC and federal and state organizations. These policies and algorithms were followed during the patient's care in the ED.  ____________________________________________   LABS (pertinent positives/negatives)  Labs Reviewed  CBC WITH DIFFERENTIAL/PLATELET - Abnormal; Notable for the following components:      Result Value   Hemoglobin 12.6 (*)    HCT 38.1 (*)    All other components within normal limits  BASIC METABOLIC PANEL - Abnormal; Notable for the following components:   Potassium 2.9 (*)    Glucose, Bld 107 (*)    BUN 7 (*)    All other components within normal limits  BRAIN NATRIURETIC PEPTIDE  TROPONIN I (HIGH SENSITIVITY)  TROPONIN I (HIGH SENSITIVITY)    RADIOLOGY Images were viewed by me  Chest x-ray IMPRESSION:  Cardiomegaly and hyperinflation, without acute disease.  ____________________________________________   DIFFERENTIAL DIAGNOSIS   Arrhythmia, MI, medication noncompliance, hypotension, dehydration, electrolyte abnormality, medication side effect  FINAL ASSESSMENT AND PLAN  Weakness, dyspnea, hypertension   Plan: The patient had presented for weakness. Patient's labs did not reveal any acute process. Patient's imaging was overall reassuring.  While he was here he was asymptomatic.  I have advised that he go back to his regular blood pressure dosing and keep a record over the next week to follow-up with his primary care doctor.   Laurence Aly, MD    Note: This note was generated in part or whole with voice recognition software. Voice recognition is usually quite accurate but there are transcription errors that can and very often do  occur. I apologize for any typographical errors that were not detected and corrected.     Earleen Newport, MD 12/16/19 [phone]

## 2020-10-27 ENCOUNTER — Other Ambulatory Visit: Payer: Self-pay | Admitting: Family Medicine

## 2020-10-27 DIAGNOSIS — I714 Abdominal aortic aneurysm, without rupture, unspecified: Secondary | ICD-10-CM

## 2020-11-06 ENCOUNTER — Ambulatory Visit
Admission: RE | Admit: 2020-11-06 | Discharge: 2020-11-06 | Disposition: A | Discharge status: Home / Self Care | Payer: Medicare HMO | Source: Ambulatory Visit | Attending: Family Medicine | Admitting: Family Medicine

## 2020-11-06 ENCOUNTER — Other Ambulatory Visit: Payer: Self-pay

## 2020-11-06 DIAGNOSIS — I714 Abdominal aortic aneurysm, without rupture, unspecified: Secondary | ICD-10-CM

## 2021-02-03 DIAGNOSIS — E669 Obesity, unspecified: Secondary | ICD-10-CM | POA: Diagnosis not present

## 2021-02-03 DIAGNOSIS — E785 Hyperlipidemia, unspecified: Secondary | ICD-10-CM | POA: Diagnosis not present

## 2021-02-03 DIAGNOSIS — F3341 Major depressive disorder, recurrent, in partial remission: Secondary | ICD-10-CM | POA: Diagnosis not present

## 2021-02-03 DIAGNOSIS — Z7982 Long term (current) use of aspirin: Secondary | ICD-10-CM | POA: Diagnosis not present

## 2021-02-03 DIAGNOSIS — M1990 Unspecified osteoarthritis, unspecified site: Secondary | ICD-10-CM | POA: Diagnosis not present

## 2021-02-03 DIAGNOSIS — Z6836 Body mass index (BMI) 36.0-36.9, adult: Secondary | ICD-10-CM | POA: Diagnosis not present

## 2021-02-03 DIAGNOSIS — I2510 Atherosclerotic heart disease of native coronary artery without angina pectoris: Secondary | ICD-10-CM | POA: Diagnosis not present

## 2021-02-03 DIAGNOSIS — N400 Enlarged prostate without lower urinary tract symptoms: Secondary | ICD-10-CM | POA: Diagnosis not present

## 2021-02-03 DIAGNOSIS — I10 Essential (primary) hypertension: Secondary | ICD-10-CM | POA: Diagnosis not present

## 2021-02-03 DIAGNOSIS — I951 Orthostatic hypotension: Secondary | ICD-10-CM | POA: Diagnosis not present

## 2021-02-03 DIAGNOSIS — K219 Gastro-esophageal reflux disease without esophagitis: Secondary | ICD-10-CM | POA: Diagnosis not present

## 2021-02-03 DIAGNOSIS — F419 Anxiety disorder, unspecified: Secondary | ICD-10-CM | POA: Diagnosis not present

## 2021-04-29 DIAGNOSIS — I10 Essential (primary) hypertension: Secondary | ICD-10-CM | POA: Diagnosis not present

## 2021-04-29 DIAGNOSIS — J309 Allergic rhinitis, unspecified: Secondary | ICD-10-CM | POA: Insufficient documentation

## 2021-04-29 DIAGNOSIS — Z0000 Encounter for general adult medical examination without abnormal findings: Secondary | ICD-10-CM | POA: Diagnosis not present

## 2021-04-29 DIAGNOSIS — D649 Anemia, unspecified: Secondary | ICD-10-CM | POA: Diagnosis not present

## 2021-06-22 DIAGNOSIS — G8929 Other chronic pain: Secondary | ICD-10-CM | POA: Diagnosis not present

## 2021-06-22 DIAGNOSIS — M5136 Other intervertebral disc degeneration, lumbar region: Secondary | ICD-10-CM | POA: Diagnosis not present

## 2021-06-22 DIAGNOSIS — M545 Low back pain, unspecified: Secondary | ICD-10-CM | POA: Diagnosis not present

## 2021-06-22 DIAGNOSIS — E669 Obesity, unspecified: Secondary | ICD-10-CM | POA: Diagnosis not present

## 2021-06-22 DIAGNOSIS — M48061 Spinal stenosis, lumbar region without neurogenic claudication: Secondary | ICD-10-CM | POA: Diagnosis not present

## 2021-08-16 DIAGNOSIS — Z23 Encounter for immunization: Secondary | ICD-10-CM | POA: Diagnosis not present

## 2021-09-02 DIAGNOSIS — I714 Abdominal aortic aneurysm, without rupture, unspecified: Secondary | ICD-10-CM | POA: Diagnosis not present

## 2021-09-02 DIAGNOSIS — M4316 Spondylolisthesis, lumbar region: Secondary | ICD-10-CM | POA: Diagnosis not present

## 2021-09-02 DIAGNOSIS — M545 Low back pain, unspecified: Secondary | ICD-10-CM | POA: Diagnosis not present

## 2021-09-02 DIAGNOSIS — M5136 Other intervertebral disc degeneration, lumbar region: Secondary | ICD-10-CM | POA: Diagnosis not present

## 2021-09-02 DIAGNOSIS — G8929 Other chronic pain: Secondary | ICD-10-CM | POA: Diagnosis not present

## 2021-09-03 ENCOUNTER — Other Ambulatory Visit: Payer: Self-pay | Admitting: Orthopedic Surgery

## 2021-09-03 DIAGNOSIS — M545 Low back pain, unspecified: Secondary | ICD-10-CM

## 2021-09-15 ENCOUNTER — Ambulatory Visit
Admission: RE | Admit: 2021-09-15 | Discharge: 2021-09-15 | Disposition: A | Discharge status: Home / Self Care | Payer: Medicare HMO | Source: Ambulatory Visit | Attending: Orthopedic Surgery | Admitting: Orthopedic Surgery

## 2021-09-15 ENCOUNTER — Other Ambulatory Visit: Payer: Self-pay

## 2021-09-15 DIAGNOSIS — M5136 Other intervertebral disc degeneration, lumbar region: Secondary | ICD-10-CM | POA: Diagnosis not present

## 2021-09-15 DIAGNOSIS — M545 Low back pain, unspecified: Secondary | ICD-10-CM | POA: Diagnosis not present

## 2021-09-15 DIAGNOSIS — G8929 Other chronic pain: Secondary | ICD-10-CM | POA: Insufficient documentation

## 2021-09-23 DIAGNOSIS — G8929 Other chronic pain: Secondary | ICD-10-CM | POA: Diagnosis not present

## 2021-09-23 DIAGNOSIS — M4316 Spondylolisthesis, lumbar region: Secondary | ICD-10-CM | POA: Diagnosis not present

## 2021-09-23 DIAGNOSIS — M5136 Other intervertebral disc degeneration, lumbar region: Secondary | ICD-10-CM | POA: Diagnosis not present

## 2021-09-23 DIAGNOSIS — I714 Abdominal aortic aneurysm, without rupture, unspecified: Secondary | ICD-10-CM | POA: Diagnosis not present

## 2021-09-23 DIAGNOSIS — M545 Low back pain, unspecified: Secondary | ICD-10-CM | POA: Diagnosis not present

## 2021-10-27 DIAGNOSIS — I714 Abdominal aortic aneurysm, without rupture, unspecified: Secondary | ICD-10-CM | POA: Diagnosis not present

## 2021-10-27 DIAGNOSIS — I10 Essential (primary) hypertension: Secondary | ICD-10-CM | POA: Diagnosis not present

## 2021-11-02 ENCOUNTER — Other Ambulatory Visit: Payer: Self-pay

## 2021-11-02 ENCOUNTER — Ambulatory Visit
Admission: EM | Admit: 2021-11-02 | Discharge: 2021-11-02 | Disposition: A | Discharge status: Home / Self Care | Payer: Medicare HMO | Attending: Internal Medicine | Admitting: Internal Medicine

## 2021-11-02 DIAGNOSIS — R21 Rash and other nonspecific skin eruption: Secondary | ICD-10-CM | POA: Diagnosis not present

## 2021-11-02 DIAGNOSIS — F40240 Claustrophobia: Secondary | ICD-10-CM | POA: Insufficient documentation

## 2021-11-02 MED ORDER — CEPHALEXIN 500 MG PO CAPS: 500.0000 mg | ORAL_CAPSULE | Freq: Two times a day (BID) | ORAL | 0 refills | Status: AC

## 2021-11-02 MED ORDER — FAMCICLOVIR 500 MG PO TABS: 500.0000 mg | ORAL_TABLET | Freq: Three times a day (TID) | ORAL | 0 refills | Status: AC

## 2021-11-02 NOTE — ED Triage Notes (Signed)
Patient is here for "insect bite" on upper right side of back. Noticed it "Sunday". Numerous "bumps" and swelling around area. No fever.  ?

## 2021-11-02 NOTE — Discharge Instructions (Addendum)
Not clear what is causing the rash/spot on your left mid back, but could be infection, insect bite(s), reaction to something that got on the skin, or inflammation from your body that is misdirected.  We will try treating for bacterial or viral (shingles) infection first--prescriptions for cephalexin (antibiotic) and famciclovir (for shingles possibility) were sent to the pharmacy.  Anticipate gradual improvement in itching/pain and size of the lesion over the next several days.  Recheck or followup with your primary care provider or a dermatologist if not improving as expected. ?

## 2021-11-02 NOTE — ED Provider Notes (Signed)
? ? ?[facility] ? ? ? ?CSN: [id number] ?Arrival date & time: 11/02/21  1119 ? ? ?  ? ?History   ?Chief Complaint ?Chief Complaint  ?Patient presents with  ? Insect Bite  ? ? ?HPI ?[name] is a 81 y.o. male.  He presents to the urgent care today with a "bite" that is itchy and sore, on his left mid back, first noticed on 3/5.  Not sure what it is, but uncomfortable.  No fever, no malaise. ? ?HPI ? ?Past Medical History:  ?Diagnosis Date  ? Hyperlipidemia   ? Hypertension   ? ? ?Patient Active Problem List  ? Diagnosis Date Noted  ? Claustrophobia 11/02/2021  ? Severe obesity (BMI 35.0-39.9) with comorbidity ([place]) 09/05/2021  ? Allergic rhinitis 04/29/2021  ? GERD (gastroesophageal reflux disease) 12/05/2018  ? Vertigo 08/08/2018  ? Elevated blood sugar level 06/20/2017  ? Abdominal aortic aneurysm (AAA) without rupture 08/18/2016  ? 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 Essential hypertension 05/28/2014  ? BPH (benign prostatic hyperplasia) 05/28/2014  ? Gross hematuria 02/19/2014  ? Lower urinary tract infectious disease 02/19/2014  ? Malignant neoplasm of prostate ([place]) 02/19/2014  ? Urinary obstruction, not elsewhere classified 02/19/2014  ? ? ?Past Surgical History:  ?Procedure Laterality Date  ? APPENDECTOMY    ? ? ? ? ? ?Home Medications   ? ?Prior to Admission medications   ?Medication Sig Start Date End Date Taking? Authorizing Provider  ?cephALEXin (KEFLEX) 500 MG capsule Take 1 capsule (500 mg total) by mouth 2 (two) times daily for 10 days. 11/02/21 11/12/21 Yes Wynona Luna, MD  ?famciclovir (FAMVIR) 500 MG tablet Take 1 tablet (500 mg total) by mouth 3 (three) times daily for 7 days. 11/02/21 11/09/21 Yes Wynona Luna, MD  ?ALPRAZolam Duanne Moron) 0.25 MG tablet Take by mouth. 09/06/21   [provider]  ?aspirin EC 81 MG tablet Take 81 mg by mouth daily.  07/21/16   [provider]  ?atorvastatin (LIPITOR) 20 MG tablet Take 20 mg by mouth daily. 05/09/18  12/16/19  [provider]  ?celecoxib (CELEBREX) 200 MG capsule Take 200 mg by mouth 2 (two) times daily. 10/26/21   [provider]  ?citalopram (CELEXA) 20 MG tablet Take 1 tablet by mouth daily. 03/08/21   [provider]  ?cloNIDine (CATAPRES) 0.1 MG tablet Take by mouth. 12/15/20   [provider]  ?finasteride (PROSCAR) 5 MG tablet Take 1 tablet by mouth daily. 05/29/14   [provider]  ?fluticasone (FLONASE) 50 MCG/ACT nasal spray Place 2 sprays into both nostrils daily. 09/27/21   [provider]  ?hydrALAZINE (APRESOLINE) 100 MG tablet Take 100 mg by mouth 2 (two) times daily. 12/12/19   [provider]  ?HYDROcodone-acetaminophen (NORCO/VICODIN) 5-325 MG tablet 1-2 tabs po bid prn ?Patient not taking: Reported on 12/16/2019 01/28/19   Norval Gable, MD  ?isosorbide mononitrate (IMDUR) 30 MG 24 hr tablet Take by mouth. 04/20/21   [provider]  ?meclizine (ANTIVERT) 25 MG tablet Take 1 tablet (25 mg total) by mouth 3 (three) times daily as needed for dizziness. ?Patient not taking: Reported on 12/16/2019 08/05/18   Schaevitz, Randall An, MD  ?meloxicam (MOBIC) 15 MG tablet Take 15 mg by mouth daily. 06/22/21   [provider]  ?metoprolol tartrate (LOPRESSOR) 50 MG tablet Take 50 mg by mouth 2 (two) times daily.     [provider]  ?Metoprolol Tartrate 75 MG TABS Take 1  tablet by mouth 2 (two) times daily. 10/06/21   [provider]  ?omeprazole (PRILOSEC) 40 MG capsule Take 1 capsule by mouth daily. 10/22/21   [provider]  ?spironolactone (ALDACTONE) 100 MG tablet Take 1 tablet by mouth daily. 03/08/21   [provider]  ?spironolactone (ALDACTONE) 25 MG tablet Take 25 mg by mouth daily. 06/08/18 12/16/19  [provider]  ?tamsulosin (FLOMAX) 0.4 MG CAPS capsule Take 0.4 mg by mouth daily.  07/20/16   [provider]  ? ? ?Family History ?No family history on file. ? ?Social  History ?Social History  ? ?Tobacco Use  ? Smoking status: Former  ?  Types: Cigarettes  ?  Quit date: 09/05/1990  ?  Years since quitting: 31.1  ? Smokeless tobacco: Never  ?Vaping Use  ? Vaping Use: Never used  ?Substance Use Topics  ? Alcohol use: Yes  ?  Alcohol/week: 1.0 standard drink  ?  Types: 1 Glasses of wine per week  ?  Comment: Occ.  ? Drug use: No  ? ? ? ?Allergies   ?Amlodipine, Hydralazine, and Lisinopril ? ? ?Review of Systems ?Review of Systems see HPI ? ? ?Physical Exam ?Triage Vital Signs ?ED Triage Vitals  ?Enc Vitals Group  ?   BP 11/02/21 1153 (!) 161/78  ?   Pulse Rate 11/02/21 1153 (!) 53  ?   Resp 11/02/21 1153 20  ?   Temp 11/02/21 1153 98.5 ?F (36.9 ?C)  ?   Temp Source 11/02/21 1153 Oral  ?   SpO2 11/02/21 1153 99 %  ?   Weight 11/02/21 1152 270 lb 1 oz (122.5 kg)  ?   Height --   ?   Pain Score 11/02/21 1151 5  ?   Pain Loc --   ? ? ?Updated Vital Signs ?BP (!) 161/78 (BP Location: Left Arm)   Pulse (!) 53   Temp 98.5 ?F (36.9 ?C) (Oral)   Resp 20   Wt 122.5 kg   SpO2 99%   BMI 35.63 kg/m?  ? ?Physical Exam ?Constitutional:   ?   General: He is not in acute distress. ?   Appearance: He is not ill-appearing or toxic-appearing.  ?   Comments: Good hygiene  ?HENT:  ?   Head: Atraumatic.  ?   Mouth/Throat:  ?   Mouth: Mucous membranes are moist.  ?Eyes:  ?   Conjunctiva/sclera:  ?   Right eye: Right conjunctiva is not injected. No exudate. ?   Left eye: Left conjunctiva is not injected. No exudate. ?   Comments: Conjugate gaze observed  ?Cardiovascular:  ?   Rate and Rhythm: Bradycardia present.  ?Pulmonary:  ?   Effort: Pulmonary effort is normal. No respiratory distress.  ?Abdominal:  ?   General: There is no distension.  ?Musculoskeletal:  ?   Cervical back: Neck supple.  ?   Comments: Walked into the urgent care independently, climbed on/off exam table quickly/easily  ?Skin: ?   General: Skin is warm and dry.  ?   Comments: No cyanosis  ?Neurological:  ?   Mental Status: He is  alert.  ?   Comments: Face symmetric, speech clear/coherent/logical  ? ? ?Photos left mid back 2x3.5"  lesion is mildly indurated, mildly tender, deep vesicles peripherally ? ? ? ? ?UC Treatments / Results  ?Labs ?(all labs ordered are listed, but only abnormal results are displayed) ?Labs Reviewed - No data to display ?NA ? ?EKG ?NA ? ?Radiology ?  No results found. ?NA ? ?Procedures ?Procedures (including critical care time) ?NA ? ?Medications Ordered in UC ?Medications - No data to display ?NA ? ?Final Clinical Impressions(s) / UC Diagnoses  ? ?Final diagnoses:  ?Rash and nonspecific skin eruption  ? ? ? ?Discharge Instructions   ? ?  ?Not clear what is causing the rash/spot on your left mid back, but could be infection, insect bite(s), reaction to something that got on the skin, or inflammation from your body that is misdirected.  We will try treating for bacterial or viral (shingles) infection first--prescriptions for cephalexin (antibiotic) and famciclovir (for shingles possibility) were sent to the pharmacy.  Anticipate gradual improvement in itching/pain and size of the lesion over the next several days.  Recheck or followup with your primary care provider or a dermatologist if not improving as expected. ? ? ?ED Prescriptions   ? ? Medication Sig Dispense Auth. Provider  ? cephALEXin (KEFLEX) 500 MG capsule Take 1 capsule (500 mg total) by mouth 2 (two) times daily for 10 days. 20 capsule Wynona Luna, MD  ? famciclovir [facility]) 500 MG tablet Take 1 tablet (500 mg total) by mouth 3 (three) times daily for 7 days. 21 tablet Wynona Luna, MD  ? ?  ? ?PDMP not reviewed this encounter. ?  ?Wynona Luna, MD ?11/03/21 1306 ? ?

## 2021-11-03 DIAGNOSIS — I10 Essential (primary) hypertension: Secondary | ICD-10-CM | POA: Diagnosis not present

## 2021-11-03 DIAGNOSIS — R7989 Other specified abnormal findings of blood chemistry: Secondary | ICD-10-CM | POA: Diagnosis not present

## 2021-11-03 DIAGNOSIS — L089 Local infection of the skin and subcutaneous tissue, unspecified: Secondary | ICD-10-CM | POA: Diagnosis not present

## 2021-11-03 DIAGNOSIS — L723 Sebaceous cyst: Secondary | ICD-10-CM | POA: Diagnosis not present

## 2022-05-03 DIAGNOSIS — Z79899 Other long term (current) drug therapy: Secondary | ICD-10-CM | POA: Diagnosis not present

## 2022-05-03 DIAGNOSIS — Z87891 Personal history of nicotine dependence: Secondary | ICD-10-CM | POA: Diagnosis not present

## 2022-05-03 DIAGNOSIS — N400 Enlarged prostate without lower urinary tract symptoms: Secondary | ICD-10-CM | POA: Diagnosis not present

## 2022-05-03 DIAGNOSIS — Z8546 Personal history of malignant neoplasm of prostate: Secondary | ICD-10-CM | POA: Diagnosis not present

## 2022-05-03 DIAGNOSIS — Z23 Encounter for immunization: Secondary | ICD-10-CM | POA: Diagnosis not present

## 2022-05-03 DIAGNOSIS — I129 Hypertensive chronic kidney disease with stage 1 through stage 4 chronic kidney disease, or unspecified chronic kidney disease: Secondary | ICD-10-CM | POA: Diagnosis not present

## 2022-05-03 DIAGNOSIS — N1831 Chronic kidney disease, stage 3a: Secondary | ICD-10-CM | POA: Diagnosis not present

## 2022-05-12 DIAGNOSIS — Z1330 Encounter for screening examination for mental health and behavioral disorders, unspecified: Secondary | ICD-10-CM | POA: Diagnosis not present

## 2022-05-12 DIAGNOSIS — Z0000 Encounter for general adult medical examination without abnormal findings: Secondary | ICD-10-CM | POA: Diagnosis not present

## 2022-05-12 DIAGNOSIS — Z1331 Encounter for screening for depression: Secondary | ICD-10-CM | POA: Diagnosis not present

## 2022-06-14 DIAGNOSIS — M4316 Spondylolisthesis, lumbar region: Secondary | ICD-10-CM | POA: Diagnosis not present

## 2022-06-14 DIAGNOSIS — M545 Low back pain, unspecified: Secondary | ICD-10-CM | POA: Diagnosis not present

## 2022-06-14 DIAGNOSIS — G8929 Other chronic pain: Secondary | ICD-10-CM | POA: Diagnosis not present

## 2022-06-30 DIAGNOSIS — M5416 Radiculopathy, lumbar region: Secondary | ICD-10-CM | POA: Diagnosis not present

## 2022-06-30 DIAGNOSIS — M48062 Spinal stenosis, lumbar region with neurogenic claudication: Secondary | ICD-10-CM | POA: Diagnosis not present

## 2022-07-27 DIAGNOSIS — M48062 Spinal stenosis, lumbar region with neurogenic claudication: Secondary | ICD-10-CM | POA: Diagnosis not present

## 2022-07-27 DIAGNOSIS — M5416 Radiculopathy, lumbar region: Secondary | ICD-10-CM | POA: Diagnosis not present

## 2022-08-16 DIAGNOSIS — M48062 Spinal stenosis, lumbar region with neurogenic claudication: Secondary | ICD-10-CM | POA: Diagnosis not present

## 2022-08-16 DIAGNOSIS — M5416 Radiculopathy, lumbar region: Secondary | ICD-10-CM | POA: Diagnosis not present

## 2022-09-14 DIAGNOSIS — M48062 Spinal stenosis, lumbar region with neurogenic claudication: Secondary | ICD-10-CM | POA: Diagnosis not present

## 2022-09-14 DIAGNOSIS — M5136 Other intervertebral disc degeneration, lumbar region: Secondary | ICD-10-CM | POA: Diagnosis not present

## 2022-09-14 DIAGNOSIS — M5416 Radiculopathy, lumbar region: Secondary | ICD-10-CM | POA: Diagnosis not present

## 2022-09-22 DIAGNOSIS — M545 Low back pain, unspecified: Secondary | ICD-10-CM | POA: Diagnosis not present

## 2022-09-22 DIAGNOSIS — R531 Weakness: Secondary | ICD-10-CM | POA: Diagnosis not present

## 2022-09-22 DIAGNOSIS — M2569 Stiffness of other specified joint, not elsewhere classified: Secondary | ICD-10-CM | POA: Diagnosis not present

## 2022-09-29 DIAGNOSIS — M2569 Stiffness of other specified joint, not elsewhere classified: Secondary | ICD-10-CM | POA: Diagnosis not present

## 2022-09-29 DIAGNOSIS — R531 Weakness: Secondary | ICD-10-CM | POA: Diagnosis not present

## 2022-09-29 DIAGNOSIS — M545 Low back pain, unspecified: Secondary | ICD-10-CM | POA: Diagnosis not present

## 2022-10-04 DIAGNOSIS — R531 Weakness: Secondary | ICD-10-CM | POA: Diagnosis not present

## 2022-10-04 DIAGNOSIS — M545 Low back pain, unspecified: Secondary | ICD-10-CM | POA: Diagnosis not present

## 2022-10-04 DIAGNOSIS — M2569 Stiffness of other specified joint, not elsewhere classified: Secondary | ICD-10-CM | POA: Diagnosis not present

## 2022-10-10 DIAGNOSIS — J069 Acute upper respiratory infection, unspecified: Secondary | ICD-10-CM | POA: Diagnosis not present

## 2022-10-10 DIAGNOSIS — Z20822 Contact with and (suspected) exposure to covid-19: Secondary | ICD-10-CM | POA: Diagnosis not present

## 2022-10-10 DIAGNOSIS — R059 Cough, unspecified: Secondary | ICD-10-CM | POA: Diagnosis not present

## 2022-11-01 DIAGNOSIS — N400 Enlarged prostate without lower urinary tract symptoms: Secondary | ICD-10-CM | POA: Diagnosis not present

## 2022-11-01 DIAGNOSIS — Z6834 Body mass index (BMI) 34.0-34.9, adult: Secondary | ICD-10-CM | POA: Diagnosis not present

## 2022-11-01 DIAGNOSIS — I10 Essential (primary) hypertension: Secondary | ICD-10-CM | POA: Diagnosis not present

## 2022-11-01 DIAGNOSIS — K760 Fatty (change of) liver, not elsewhere classified: Secondary | ICD-10-CM | POA: Diagnosis not present

## 2022-11-01 DIAGNOSIS — I714 Abdominal aortic aneurysm, without rupture, unspecified: Secondary | ICD-10-CM | POA: Diagnosis not present

## 2022-11-01 DIAGNOSIS — N1831 Chronic kidney disease, stage 3a: Secondary | ICD-10-CM | POA: Diagnosis not present

## 2022-11-01 DIAGNOSIS — I129 Hypertensive chronic kidney disease with stage 1 through stage 4 chronic kidney disease, or unspecified chronic kidney disease: Secondary | ICD-10-CM | POA: Diagnosis not present

## 2022-11-08 DIAGNOSIS — E871 Hypo-osmolality and hyponatremia: Secondary | ICD-10-CM | POA: Diagnosis not present

## 2023-05-17 DIAGNOSIS — Z23 Encounter for immunization: Secondary | ICD-10-CM | POA: Diagnosis not present

## 2023-05-17 DIAGNOSIS — N1831 Chronic kidney disease, stage 3a: Secondary | ICD-10-CM | POA: Diagnosis not present

## 2023-05-17 DIAGNOSIS — I129 Hypertensive chronic kidney disease with stage 1 through stage 4 chronic kidney disease, or unspecified chronic kidney disease: Secondary | ICD-10-CM | POA: Diagnosis not present

## 2023-05-17 DIAGNOSIS — D649 Anemia, unspecified: Secondary | ICD-10-CM | POA: Diagnosis not present

## 2023-05-17 DIAGNOSIS — Z87891 Personal history of nicotine dependence: Secondary | ICD-10-CM | POA: Diagnosis not present

## 2023-05-17 DIAGNOSIS — K760 Fatty (change of) liver, not elsewhere classified: Secondary | ICD-10-CM | POA: Diagnosis not present

## 2023-05-17 DIAGNOSIS — Z79899 Other long term (current) drug therapy: Secondary | ICD-10-CM | POA: Diagnosis not present

## 2023-06-15 DIAGNOSIS — Z0000 Encounter for general adult medical examination without abnormal findings: Secondary | ICD-10-CM | POA: Diagnosis not present

## 2023-06-15 DIAGNOSIS — Z23 Encounter for immunization: Secondary | ICD-10-CM | POA: Diagnosis not present

## 2023-06-15 DIAGNOSIS — Z1330 Encounter for screening examination for mental health and behavioral disorders, unspecified: Secondary | ICD-10-CM | POA: Diagnosis not present

## 2023-06-15 DIAGNOSIS — Z1331 Encounter for screening for depression: Secondary | ICD-10-CM | POA: Diagnosis not present

## 2023-07-20 ENCOUNTER — Other Ambulatory Visit: Payer: Self-pay | Admitting: Family Medicine

## 2023-07-20 DIAGNOSIS — K760 Fatty (change of) liver, not elsewhere classified: Secondary | ICD-10-CM

## 2023-11-15 ENCOUNTER — Other Ambulatory Visit: Payer: Self-pay | Admitting: Family Medicine

## 2023-11-15 ENCOUNTER — Ambulatory Visit
Admission: RE | Admit: 2023-11-15 | Discharge: 2023-11-15 | Disposition: A | Discharge status: Home / Self Care | Attending: Family Medicine | Admitting: Family Medicine

## 2023-11-15 ENCOUNTER — Ambulatory Visit
Admission: RE | Admit: 2023-11-15 | Discharge: 2023-11-15 | Disposition: A | Discharge status: Home / Self Care | Source: Ambulatory Visit | Attending: Family Medicine | Admitting: Family Medicine

## 2023-11-15 DIAGNOSIS — M545 Low back pain, unspecified: Secondary | ICD-10-CM

## 2023-11-15 DIAGNOSIS — Z6835 Body mass index (BMI) 35.0-35.9, adult: Secondary | ICD-10-CM | POA: Diagnosis not present

## 2023-11-15 DIAGNOSIS — I700 Atherosclerosis of aorta: Secondary | ICD-10-CM | POA: Diagnosis not present

## 2023-11-15 DIAGNOSIS — I129 Hypertensive chronic kidney disease with stage 1 through stage 4 chronic kidney disease, or unspecified chronic kidney disease: Secondary | ICD-10-CM | POA: Diagnosis not present

## 2023-11-15 DIAGNOSIS — M5136 Other intervertebral disc degeneration, lumbar region with discogenic back pain only: Secondary | ICD-10-CM | POA: Diagnosis not present

## 2023-11-15 DIAGNOSIS — I10 Essential (primary) hypertension: Secondary | ICD-10-CM | POA: Diagnosis not present

## 2023-11-15 DIAGNOSIS — G8929 Other chronic pain: Secondary | ICD-10-CM | POA: Diagnosis not present

## 2023-11-15 DIAGNOSIS — N1831 Chronic kidney disease, stage 3a: Secondary | ICD-10-CM | POA: Diagnosis not present

## 2023-12-05 DIAGNOSIS — B351 Tinea unguium: Secondary | ICD-10-CM | POA: Diagnosis not present

## 2023-12-05 DIAGNOSIS — M79675 Pain in left toe(s): Secondary | ICD-10-CM | POA: Diagnosis not present

## 2023-12-05 DIAGNOSIS — M79674 Pain in right toe(s): Secondary | ICD-10-CM | POA: Diagnosis not present

## 2023-12-13 DIAGNOSIS — I10 Essential (primary) hypertension: Secondary | ICD-10-CM | POA: Diagnosis not present

## 2023-12-13 DIAGNOSIS — M51369 Other intervertebral disc degeneration, lumbar region without mention of lumbar back pain or lower extremity pain: Secondary | ICD-10-CM | POA: Diagnosis not present

## 2024-03-26 DIAGNOSIS — M79675 Pain in left toe(s): Secondary | ICD-10-CM | POA: Diagnosis not present

## 2024-03-26 DIAGNOSIS — B351 Tinea unguium: Secondary | ICD-10-CM | POA: Diagnosis not present

## 2024-03-26 DIAGNOSIS — I739 Peripheral vascular disease, unspecified: Secondary | ICD-10-CM | POA: Diagnosis not present

## 2024-03-26 DIAGNOSIS — M79674 Pain in right toe(s): Secondary | ICD-10-CM | POA: Diagnosis not present
